# Patient Record
Sex: Female | Born: 1980
Health system: Southern US, Community
[De-identification: ages and names within clinical notes are randomized; demographics above are authoritative.]

## PROBLEM LIST (undated history)

## (undated) DIAGNOSIS — D649 Anemia, unspecified: Secondary | ICD-10-CM

## (undated) DIAGNOSIS — T8859XA Other complications of anesthesia, initial encounter: Secondary | ICD-10-CM

## (undated) HISTORY — PX: NO PAST SURGERIES: SHX2092

---

## 2019-03-09 DIAGNOSIS — Z118 Encounter for screening for other infectious and parasitic diseases: Secondary | ICD-10-CM | POA: Diagnosis not present

## 2019-03-09 DIAGNOSIS — Z01419 Encounter for gynecological examination (general) (routine) without abnormal findings: Secondary | ICD-10-CM | POA: Diagnosis not present

## 2019-03-09 DIAGNOSIS — N898 Other specified noninflammatory disorders of vagina: Secondary | ICD-10-CM | POA: Diagnosis not present

## 2019-03-09 DIAGNOSIS — Z6823 Body mass index (BMI) 23.0-23.9, adult: Secondary | ICD-10-CM | POA: Diagnosis not present

## 2019-03-09 DIAGNOSIS — Z124 Encounter for screening for malignant neoplasm of cervix: Secondary | ICD-10-CM | POA: Diagnosis not present

## 2019-03-09 DIAGNOSIS — N979 Female infertility, unspecified: Secondary | ICD-10-CM | POA: Diagnosis not present

## 2019-03-09 DIAGNOSIS — Z1151 Encounter for screening for human papillomavirus (HPV): Secondary | ICD-10-CM | POA: Diagnosis not present

## 2019-03-13 DIAGNOSIS — Z13220 Encounter for screening for lipoid disorders: Secondary | ICD-10-CM | POA: Diagnosis not present

## 2019-03-13 DIAGNOSIS — Z130 Encounter for screening for diseases of the blood and blood-forming organs and certain disorders involving the immune mechanism: Secondary | ICD-10-CM | POA: Diagnosis not present

## 2019-03-13 DIAGNOSIS — Z0000 Encounter for general adult medical examination without abnormal findings: Secondary | ICD-10-CM | POA: Diagnosis not present

## 2019-03-13 DIAGNOSIS — Z131 Encounter for screening for diabetes mellitus: Secondary | ICD-10-CM | POA: Diagnosis not present

## 2019-03-13 DIAGNOSIS — Z1329 Encounter for screening for other suspected endocrine disorder: Secondary | ICD-10-CM | POA: Diagnosis not present

## 2019-03-13 DIAGNOSIS — Z3169 Encounter for other general counseling and advice on procreation: Secondary | ICD-10-CM | POA: Diagnosis not present

## 2019-04-02 DIAGNOSIS — N970 Female infertility associated with anovulation: Secondary | ICD-10-CM | POA: Diagnosis not present

## 2019-04-06 DIAGNOSIS — Z3169 Encounter for other general counseling and advice on procreation: Secondary | ICD-10-CM | POA: Diagnosis not present

## 2019-04-20 DIAGNOSIS — N979 Female infertility, unspecified: Secondary | ICD-10-CM | POA: Diagnosis not present

## 2019-04-20 DIAGNOSIS — Z3689 Encounter for other specified antenatal screening: Secondary | ICD-10-CM | POA: Diagnosis not present

## 2019-04-20 DIAGNOSIS — Z3201 Encounter for pregnancy test, result positive: Secondary | ICD-10-CM | POA: Diagnosis not present

## 2019-04-20 LAB — OB RESULTS CONSOLE ABO/RH: RH Type: POSITIVE

## 2019-04-20 LAB — OB RESULTS CONSOLE ANTIBODY SCREEN: Antibody Screen: NEGATIVE

## 2019-05-15 DIAGNOSIS — Z3201 Encounter for pregnancy test, result positive: Secondary | ICD-10-CM | POA: Diagnosis not present

## 2019-05-30 DIAGNOSIS — Z118 Encounter for screening for other infectious and parasitic diseases: Secondary | ICD-10-CM | POA: Diagnosis not present

## 2019-05-30 DIAGNOSIS — O09521 Supervision of elderly multigravida, first trimester: Secondary | ICD-10-CM | POA: Diagnosis not present

## 2019-05-30 DIAGNOSIS — Z3A10 10 weeks gestation of pregnancy: Secondary | ICD-10-CM | POA: Diagnosis not present

## 2019-05-30 DIAGNOSIS — Z3689 Encounter for other specified antenatal screening: Secondary | ICD-10-CM | POA: Diagnosis not present

## 2019-05-30 LAB — OB RESULTS CONSOLE HEPATITIS B SURFACE ANTIGEN: Hepatitis B Surface Ag: NEGATIVE

## 2019-05-30 LAB — OB RESULTS CONSOLE RUBELLA ANTIBODY, IGM: Rubella: IMMUNE

## 2019-05-30 LAB — OB RESULTS CONSOLE HIV ANTIBODY (ROUTINE TESTING): HIV: NONREACTIVE

## 2019-05-30 LAB — OB RESULTS CONSOLE RPR: RPR: NONREACTIVE

## 2019-06-04 ENCOUNTER — Inpatient Hospital Stay (HOSPITAL_COMMUNITY): Admission: AD | Admit: 2019-06-04 | Payer: Self-pay | Source: Home / Self Care | Admitting: Obstetrics

## 2019-06-19 DIAGNOSIS — Z3A13 13 weeks gestation of pregnancy: Secondary | ICD-10-CM | POA: Diagnosis not present

## 2019-06-19 DIAGNOSIS — O09521 Supervision of elderly multigravida, first trimester: Secondary | ICD-10-CM | POA: Diagnosis not present

## 2019-06-19 DIAGNOSIS — Z3682 Encounter for antenatal screening for nuchal translucency: Secondary | ICD-10-CM | POA: Diagnosis not present

## 2019-06-28 DIAGNOSIS — Z111 Encounter for screening for respiratory tuberculosis: Secondary | ICD-10-CM | POA: Diagnosis not present

## 2019-06-30 DIAGNOSIS — Z111 Encounter for screening for respiratory tuberculosis: Secondary | ICD-10-CM | POA: Diagnosis not present

## 2019-07-06 DIAGNOSIS — Z111 Encounter for screening for respiratory tuberculosis: Secondary | ICD-10-CM | POA: Diagnosis not present

## 2019-07-12 DIAGNOSIS — O09522 Supervision of elderly multigravida, second trimester: Secondary | ICD-10-CM | POA: Diagnosis not present

## 2019-07-12 DIAGNOSIS — Z361 Encounter for antenatal screening for raised alphafetoprotein level: Secondary | ICD-10-CM | POA: Diagnosis not present

## 2019-07-12 DIAGNOSIS — Z3A16 16 weeks gestation of pregnancy: Secondary | ICD-10-CM | POA: Diagnosis not present

## 2019-08-02 DIAGNOSIS — O09522 Supervision of elderly multigravida, second trimester: Secondary | ICD-10-CM | POA: Diagnosis not present

## 2019-08-02 DIAGNOSIS — Z3A19 19 weeks gestation of pregnancy: Secondary | ICD-10-CM | POA: Diagnosis not present

## 2019-08-14 ENCOUNTER — Ambulatory Visit
Admission: RE | Admit: 2019-08-14 | Discharge: 2019-08-14 | Disposition: A | Discharge status: Home / Self Care | Payer: BC Managed Care – PPO | Source: Ambulatory Visit | Attending: Internal Medicine | Admitting: Internal Medicine

## 2019-08-14 ENCOUNTER — Other Ambulatory Visit: Payer: Self-pay

## 2019-08-14 ENCOUNTER — Ambulatory Visit: Payer: BC Managed Care – PPO | Admitting: Internal Medicine

## 2019-08-14 ENCOUNTER — Encounter: Payer: Self-pay | Admitting: Internal Medicine

## 2019-08-14 DIAGNOSIS — J984 Other disorders of lung: Secondary | ICD-10-CM | POA: Diagnosis not present

## 2019-08-14 DIAGNOSIS — R7612 Nonspecific reaction to cell mediated immunity measurement of gamma interferon antigen response without active tuberculosis: Secondary | ICD-10-CM

## 2019-08-14 DIAGNOSIS — J90 Pleural effusion, not elsewhere classified: Secondary | ICD-10-CM | POA: Diagnosis not present

## 2019-08-14 NOTE — Progress Notes (Signed)
°    [facility]      Reason for Consult: probable latent Tb    Referring Physician: Dr. Juliene Pina    Patient ID: Virginia Strickland, female    DOB: 1981/02/18, 39 y.o.   MRN: [id number]  HPI:   She is here for evaluation of a positive Quantiferon Gold test.   She is currently pregnant with a due date in October and was tested as part of routine testing by her OB/GYN.  She is HIV negative, hepatitis B negative.  She is having no fever, no sob, no DOE, no cough or sputum production.  No night sweats.  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  She has obtained CMA status here.  She has not had any exposures recently that she is aware of. She did have the BCG vaccine.  She refused an interpreter, interview done in [place].   PMH: pregnant  Prior to Admission medications   Medication Sig Start Date End Date Taking? Authorizing Provider  Prenatal Vit-Fe Fumarate-FA (PRENATAL MULTIVITAMIN) TABS tablet Take 1 tablet by mouth daily at 12 noon.   Yes [provider]    No Known Allergies  Social History   Tobacco Use   Smoking status: Not on file  Substance Use Topics   Alcohol use: Not on file   Drug use: Not on file   [facility]: no known family members with Tb  Review of Systems  Constitutional: negative for fevers and chills Respiratory: negative for cough, sputum, hemoptysis, pleurisy/chest pain, pneumonia or dyspnea on exertion Gastrointestinal: negative for nausea and diarrhea Integument/breast: negative for rash All other systems reviewed and are negative    Constitutional: in no apparent distress There were no vitals filed for this visit. EYES: anicteric Cardiovascular: Cor RRR Respiratory: clear GI: gravid Musculoskeletal: no edema Skin: negatives: no rash Neuro: non-focal  Labs: No results found for: WBC, HGB, HCT, MCV, PLT No results found for: CREATININE, BUN, NA, K, CL, CO2 No results found for: ALT, AST,  GGT, ALKPHOS, BILITOT, INR   Assessment: Latent Tb.  I discussed the risks of latent Tb and conversion to active Tb and options for treatment.  She has no symptoms and verified with a CXR that her lungs are clear.  At this time, she does not need treatment and will offer her treatment for latent Tb after delivery of her baby.    Plan: 1) CXR - no acute disease 2) rtc 3-4 months after delivery for consideration of latent Tb treatment.

## 2019-08-15 ENCOUNTER — Encounter: Payer: Self-pay | Admitting: Internal Medicine

## 2019-08-15 ENCOUNTER — Telehealth: Payer: Self-pay

## 2019-08-15 NOTE — Telephone Encounter (Signed)
Using pacific interpreters relayed chest xray results to patient; had to leave VM. Patient instructed to call with any questions or concerns.  Kasiya Burck Loyola Mast, RN

## 2019-08-15 NOTE — Telephone Encounter (Signed)
-----   Message from Gardiner Barefoot, MD sent at 08/15/2019 10:29 AM EDT ----- Can you let her know her CXR is just fine; has latent Tb.  Will treat her sometime in the spring after she delivers later this year.  thanks

## 2019-08-23 NOTE — Telephone Encounter (Signed)
Patient returned call and LPN was able to relay results and assist patient in signing up for MyChart.  Virginia Strickland

## 2019-08-31 DIAGNOSIS — Z3A23 23 weeks gestation of pregnancy: Secondary | ICD-10-CM | POA: Diagnosis not present

## 2019-08-31 DIAGNOSIS — O09522 Supervision of elderly multigravida, second trimester: Secondary | ICD-10-CM | POA: Diagnosis not present

## 2019-09-21 DIAGNOSIS — Z3A26 26 weeks gestation of pregnancy: Secondary | ICD-10-CM | POA: Diagnosis not present

## 2019-09-21 DIAGNOSIS — O09522 Supervision of elderly multigravida, second trimester: Secondary | ICD-10-CM | POA: Diagnosis not present

## 2019-10-04 DIAGNOSIS — Z23 Encounter for immunization: Secondary | ICD-10-CM | POA: Diagnosis not present

## 2019-10-04 DIAGNOSIS — O09523 Supervision of elderly multigravida, third trimester: Secondary | ICD-10-CM | POA: Diagnosis not present

## 2019-10-04 DIAGNOSIS — Z3A28 28 weeks gestation of pregnancy: Secondary | ICD-10-CM | POA: Diagnosis not present

## 2019-10-04 DIAGNOSIS — Z3689 Encounter for other specified antenatal screening: Secondary | ICD-10-CM | POA: Diagnosis not present

## 2019-10-18 DIAGNOSIS — O09523 Supervision of elderly multigravida, third trimester: Secondary | ICD-10-CM | POA: Diagnosis not present

## 2019-10-18 DIAGNOSIS — Z3A30 30 weeks gestation of pregnancy: Secondary | ICD-10-CM | POA: Diagnosis not present

## 2019-11-01 DIAGNOSIS — O09523 Supervision of elderly multigravida, third trimester: Secondary | ICD-10-CM | POA: Diagnosis not present

## 2019-11-01 DIAGNOSIS — O3663X0 Maternal care for excessive fetal growth, third trimester, not applicable or unspecified: Secondary | ICD-10-CM | POA: Diagnosis not present

## 2019-11-01 DIAGNOSIS — Z3A32 32 weeks gestation of pregnancy: Secondary | ICD-10-CM | POA: Diagnosis not present

## 2019-11-15 DIAGNOSIS — O09523 Supervision of elderly multigravida, third trimester: Secondary | ICD-10-CM | POA: Diagnosis not present

## 2019-11-15 DIAGNOSIS — O351XX0 Maternal care for (suspected) chromosomal abnormality in fetus, not applicable or unspecified: Secondary | ICD-10-CM | POA: Diagnosis not present

## 2019-11-15 DIAGNOSIS — Z3A34 34 weeks gestation of pregnancy: Secondary | ICD-10-CM | POA: Diagnosis not present

## 2019-11-29 ENCOUNTER — Other Ambulatory Visit: Payer: Self-pay

## 2019-11-29 ENCOUNTER — Inpatient Hospital Stay: Payer: BC Managed Care – PPO

## 2019-11-29 ENCOUNTER — Encounter (HOSPITAL_COMMUNITY): Payer: Self-pay | Admitting: Obstetrics & Gynecology

## 2019-11-29 ENCOUNTER — Inpatient Hospital Stay (HOSPITAL_BASED_OUTPATIENT_CLINIC_OR_DEPARTMENT_OTHER): Payer: BC Managed Care – PPO

## 2019-11-29 ENCOUNTER — Inpatient Hospital Stay (HOSPITAL_COMMUNITY)
Admission: AD | Admit: 2019-11-29 | Discharge: 2019-11-29 | Disposition: A | Discharge status: Home / Self Care | Payer: BC Managed Care – PPO | Attending: Obstetrics & Gynecology | Admitting: Obstetrics & Gynecology

## 2019-11-29 DIAGNOSIS — Z3685 Encounter for antenatal screening for Streptococcus B: Secondary | ICD-10-CM | POA: Diagnosis not present

## 2019-11-29 DIAGNOSIS — Z3A36 36 weeks gestation of pregnancy: Secondary | ICD-10-CM

## 2019-11-29 DIAGNOSIS — O289 Unspecified abnormal findings on antenatal screening of mother: Secondary | ICD-10-CM

## 2019-11-29 DIAGNOSIS — O368130 Decreased fetal movements, third trimester, not applicable or unspecified: Secondary | ICD-10-CM | POA: Diagnosis not present

## 2019-11-29 DIAGNOSIS — O368390 Maternal care for abnormalities of the fetal heart rate or rhythm, unspecified trimester, not applicable or unspecified: Secondary | ICD-10-CM

## 2019-11-29 DIAGNOSIS — O09523 Supervision of elderly multigravida, third trimester: Secondary | ICD-10-CM | POA: Insufficient documentation

## 2019-11-29 DIAGNOSIS — O403XX0 Polyhydramnios, third trimester, not applicable or unspecified: Secondary | ICD-10-CM | POA: Diagnosis not present

## 2019-11-29 DIAGNOSIS — Z79899 Other long term (current) drug therapy: Secondary | ICD-10-CM | POA: Diagnosis not present

## 2019-11-29 DIAGNOSIS — O321XX0 Maternal care for breech presentation, not applicable or unspecified: Secondary | ICD-10-CM | POA: Diagnosis not present

## 2019-11-29 HISTORY — DX: Anemia, unspecified: D64.9

## 2019-11-29 LAB — OB RESULTS CONSOLE GBS: GBS: NEGATIVE

## 2019-11-29 NOTE — MAU Provider Note (Signed)
History     CSN: [id number]  Arrival date and time: 11/29/19 1218   First Provider Initiated Contact with Patient 11/29/19 1300      Chief Complaint  Patient presents with   Decreased Fetal Movement   failed NST   Ms. Virginia Strickland is a 39 y.o. year old [id number] female at 6068w4d weeks gestation who was [facility] for prolonged monitoring. She failed an NST in the office today. She denies VB, LOF or pain/contractions. Her pregnancy is complicated by AMA, LT renal pyelectasis and breech presentation (as of 11/15/19). Dr. Juliene PinaMody is her primary provider.    OB History    Gravida  2   Para      Term      Preterm      AB  1   Living  0     SAB  1   TAB      Ectopic      Multiple      Live Births  0           Past Medical History:  Diagnosis Date   Anemia     Past Surgical History:  Procedure Laterality Date   NO PAST SURGERIES      Family History  Problem Relation Age of Onset   Hypertension Mother    Asthma Mother    Hypertension Father     Social History   Tobacco Use   Smoking status: Never Smoker   Smokeless tobacco: Never Used  Vaping Use   Vaping Use: Never used  Substance Use Topics   Alcohol use: Never   Drug use: Never    Allergies: No Known Allergies  Medications Prior to Admission  Medication Sig Dispense Refill Last Dose   Prenatal Vit-Fe Fumarate-FA (PRENATAL MULTIVITAMIN) TABS tablet Take 1 tablet by mouth daily at 12 noon.   11/28/2019 at Unknown time    Review of Systems  Constitutional: Negative.   HENT: Negative.   Eyes: Negative.   Respiratory: Negative.   Cardiovascular: Negative.   Gastrointestinal: Negative.   Endocrine: Negative.   Genitourinary: Negative.   Musculoskeletal: Negative.   Skin: Negative.   Allergic/Immunologic: Negative.   Neurological: Negative.   Hematological: Negative.   Psychiatric/Behavioral: Negative.    Physical Exam   Blood pressure  119/78, pulse 85, temperature 97.7 F (36.5 C), temperature source Oral, resp. rate 16, weight 78.8 kg, SpO2 100 %.  Physical Exam Vitals and nursing note reviewed.  Constitutional:      Appearance: Normal appearance.  HENT:     Head: Normocephalic and atraumatic.  Cardiovascular:     Rate and Rhythm: Normal rate.  Pulmonary:     Effort: Pulmonary effort is normal.  Genitourinary:    Comments: deferred Skin:    General: Skin is warm and dry.  Neurological:     Mental Status: She is alert and oriented to person, place, and time.  Psychiatric:        Mood and Affect: Mood normal.        Behavior: Behavior normal.        Thought Content: Thought content normal.        Judgment: Judgment normal.    REACTIVE NST - FHR: 130 bpm / moderate variability / accels present / decels absent / TOCO: regular every 10 mins -- patient not aware of contractions  MAU Course  Procedures  MDM CEFM BPP U/S US MFM FETAL BPP WO NON STRESS  Result Date:  11/29/2019 ----------------------------------------------------------------------  OBSTETRICS REPORT                       (Signed Final 11/29/2019 02:23 pm) ---------------------------------------------------------------------- Patient Info  ID #:       [id number]                          D.O.B.:  1980/12/13 (39 yrs)  Name:       Virginia Strickland                  Visit Date: 11/29/2019 01:59 pm              Strickland ---------------------------------------------------------------------- Performed By  Attending:        Lin Landsman      Ref. Address:     [address]                    MD                                                             [address]                                                             [place], [place]                                                             [address]  Performed By:     Reinaldo Raddle            [facility]  Referred By:       Raelyn Mora CNM ---------------------------------------------------------------------- Orders  #  Description                           Code        Ordered By  1  [place] MFM FETAL BPP WO NON               54008.67    Zarin Hagmann     STRESS ----------------------------------------------------------------------  #  Order #                     Accession #                Episode #  1  [id number]  [id number]                 [id number] ---------------------------------------------------------------------- Indications  [redacted] weeks gestation of pregnancy                Z3A.36  Non-reactive NST                               O28.9 ---------------------------------------------------------------------- Fetal Evaluation  Num Of Fetuses:         1  Fetal Heart Rate(bpm):  158  Cardiac Activity:       Observed  Presentation:           Breech  Placenta:               Anterior  P. Cord Insertion:      Visualized, central  Amniotic Fluid  AFI FV:      Subjectively upper-normal  AFI Sum(cm)     %Tile       Largest Pocket(cm)  27.63           97          8.41  RUQ(cm)       RLQ(cm)       LUQ(cm)        LLQ(cm)  5.91          8.41          8.33           4.98 ---------------------------------------------------------------------- Biophysical Evaluation  Amniotic F.V:   Increased                  F. Tone:        Observed  F. Movement:    Observed                   Score:          8/8  F. Breathing:   Observed ---------------------------------------------------------------------- Biometry  LV:        4.9  mm ---------------------------------------------------------------------- OB History  Gravidity:    2  Living:       0 ---------------------------------------------------------------------- Gestational Age  Clinical EDD:  36w 4d                                        EDD:   12/23/19  Best:          36w 4d     Det. By:  Clinical EDD             EDD:   12/23/19  ---------------------------------------------------------------------- Anatomy  Ventricles:            Appears normal         Stomach:                Appears normal, left                                                                        sided  Diaphragm:             Appears normal         Bladder:  Appears normal ---------------------------------------------------------------------- Cervix Uterus Adnexa  Cervix  Not visualized (advanced GA >24wks)  Uterus  No abnormality visualized.  Right Ovary  Not visualized.  Left Ovary  Not visualized.  Cul De Sac  No free fluid seen.  Adnexa  No adnexal mass visualized. ---------------------------------------------------------------------- Impression  Limited exam to assess non-reactive NST.  Biophysical profile 8/8 with good fetal movement and  amniotic fluid volume  Consider daily maternal kick counts.  Polyhydramnios is noted today. ---------------------------------------------------------------------- Recommendations  Follow up growth in 4 weeks  Consider weekly testing. ----------------------------------------------------------------------               Lin Landsman, MD Electronically Signed Final Report   11/29/2019 02:23 pm ----------------------------------------------------------------------   Assessment and Plan  Decreased fetal movements in third trimester, single or unspecified fetus - Dr. Juliene Pina on unit at 1420 and assumes care of patient. Patient instructions and discharge by Dr. Juliene Pina.    Raelyn Mora, MSN, CNM 11/29/2019, 1:07 PM

## 2019-11-29 NOTE — MAU Note (Addendum)
Office had called in. Pt had failed NST, ? Issues on [place].  For prolonged monitoring and BPP.  Pt denies any bleeding, leaking or pain.

## 2019-11-29 NOTE — Progress Notes (Addendum)
MD note Pt seen in office for NST, AFI and position check since AMA at age 39. NST overall mod variability and no decels, but accels only 10/x10  Sono - AFI up to 24.5 cm from 17 cm 2 wks back, Breech, possible skin edema on head and no active fetal movements or breathing noted while scanning.   Pt is NPO since 9 am, advised to stay NPO and sent to MAU for longer monitoring and BPP and reassess after that, will consult MFM  V.Joann Jorge MD  Spoke with Dr Grace Bushy, BPP 8/8, polyhydramnios noted, no skin edema on head.  MAU NST reactive  Mother feels FMs since leaving my office.  Reviewed strict FAC three times daily Labor s/s. Breech-- recc C/s at 39 wks.  Office visit 2x/wk, NST on 10/4, DrMody's office will call her   --V.ModyMD

## 2019-11-29 NOTE — Discharge Instructions (Signed)
Third Trimester of Pregnancy  The third trimester is from week 28 through week 40 (months 7 through 9). This trimester is when your unborn baby (fetus) is growing very fast. At the end of the ninth month, the unborn baby is about 20 inches in length. It weighs about 6-10 pounds. Follow these instructions at home: Medicines  Take over-the-counter and prescription medicines only as told by your doctor. Some medicines are safe and some medicines are not safe during pregnancy.  Take a prenatal vitamin that contains at least 600 micrograms (mcg) of folic acid.  If you have trouble pooping (constipation), take medicine that will make your stool soft (stool softener) if your doctor approves. Eating and drinking   Eat regular, healthy meals.  Avoid raw meat and uncooked cheese.  If you get low calcium from the food you eat, talk to your doctor about taking a daily calcium supplement.  Eat four or five small meals rather than three large meals a day.  Avoid foods that are high in fat and sugars, such as fried and sweet foods.  To prevent constipation: ? Eat foods that are high in fiber, like fresh fruits and vegetables, whole grains, and beans. ? Drink enough fluids to keep your pee (urine) clear or pale yellow. Activity  Exercise only as told by your doctor. Stop exercising if you start to have cramps.  Avoid heavy lifting, wear low heels, and sit up straight.  Do not exercise if it is too hot, too humid, or if you are in a place of great height (high altitude).  You may continue to have sex unless your doctor tells you not to. Relieving pain and discomfort  Wear a good support bra if your breasts are tender.  Take frequent breaks and rest with your legs raised if you have leg cramps or low back pain.  Take warm water baths (sitz baths) to soothe pain or discomfort caused by hemorrhoids. Use hemorrhoid cream if your doctor approves.  If you develop puffy, bulging veins (varicose  veins) in your legs: ? Wear support hose or compression stockings as told by your doctor. ? Raise (elevate) your feet for 15 minutes, 3-4 times a day. ? Limit salt in your food. Safety  Wear your seat belt when driving.  Make a list of emergency phone numbers, including numbers for family, friends, the hospital, and police and fire departments. Preparing for your baby's arrival To prepare for the arrival of your baby:  Take prenatal classes.  Practice driving to the hospital.  Visit the hospital and tour the maternity area.  Talk to your work about taking leave once the baby comes.  Pack your hospital bag.  Prepare the baby's room.  Go to your doctor visits.  Buy a rear-facing car seat. Learn how to install it in your car. General instructions  Do not use hot tubs, steam rooms, or saunas.  Do not use any products that contain nicotine or tobacco, such as cigarettes and e-cigarettes. If you need help quitting, ask your doctor.  Do not drink alcohol.  Do not douche or use tampons or scented sanitary pads.  Do not cross your legs for long periods of time.  Do not travel for long distances unless you must. Only do so if your doctor says it is okay.  Visit your dentist if you have not gone during your pregnancy. Use a soft toothbrush to brush your teeth. Be gentle when you floss.  Avoid cat litter boxes and soil   used by cats. These carry germs that can cause birth defects in the baby and can cause a loss of your baby (miscarriage) or stillbirth.  Keep all your prenatal visits as told by your doctor. This is important. Contact a doctor if:  You are not sure if you are in labor or if your water has broken.  You are dizzy.  You have mild cramps or pressure in your lower belly.  You have a nagging pain in your belly area.  You continue to feel sick to your stomach, you throw up, or you have watery poop.  You have bad smelling fluid coming from your vagina.  You have  pain when you pee. Get help right away if:  You have a fever.  You are leaking fluid from your vagina.  You are spotting or bleeding from your vagina.  You have severe belly cramps or pain.  You lose or gain weight quickly.  You have trouble catching your breath and have chest pain.  You notice sudden or extreme puffiness (swelling) of your face, hands, ankles, feet, or legs.  You have not felt the baby move in over an hour.  You have severe headaches that do not go away with medicine.  You have trouble seeing.  You are leaking, or you are having a gush of fluid, from your vagina before you are 37 weeks.  You have regular belly spasms (contractions) before you are 37 weeks. Summary  The third trimester is from week 28 through week 40 (months 7 through 9). This time is when your unborn baby is growing very fast.  Follow your doctor's advice about medicine, food, and activity.  Get ready for the arrival of your baby by taking prenatal classes, getting all the baby items ready, preparing the baby's room, and visiting your doctor to be checked.  Get help right away if you are bleeding from your vagina, or you have chest pain and trouble catching your breath, or if you have not felt your baby move in over an hour. This information is not intended to replace advice given to you by your health care provider. Make sure you discuss any questions you have with your health care provider. Document Revised: 06/08/2018 Document Reviewed: 03/23/2016 Elsevier Patient Education  2020 Elsevier Inc.   Fetal Movement Counts Patient Name: ________________________________________________ Patient Due Date: ____________________ What is a fetal movement count?  A fetal movement count is the number of times that you feel your baby move during a certain amount of time. This may also be called a fetal kick count. A fetal movement count is recommended for every pregnant woman. You may be asked to  start counting fetal movements as early as week 28 of your pregnancy. Pay attention to when your baby is most active. You may notice your baby's sleep and wake cycles. You may also notice things that make your baby move more. You should do a fetal movement count:  When your baby is normally most active.  At the same time each day. A good time to count movements is while you are resting, after having something to eat and drink. How do I count fetal movements? 1. Find a quiet, comfortable area. Sit, or lie down on your side. 2. Write down the date, the start time and stop time, and the number of movements that you felt between those two times. Take this information with you to your health care visits. 3. Write down your start time when you feel   the first movement. 4. Count kicks, flutters, swishes, rolls, and jabs. You should feel at least 10 movements. 5. You may stop counting after you have felt 10 movements, or if you have been counting for 2 hours. Write down the stop time. 6. If you do not feel 10 movements in 2 hours, contact your health care provider for further instructions. Your health care provider may want to do additional tests to assess your baby's well-being. Contact a health care provider if:  You feel fewer than 10 movements in 2 hours.  Your baby is not moving like he or she usually does. Date: ____________ Start time: ____________ Stop time: ____________ Movements: ____________ Date: ____________ Start time: ____________ Stop time: ____________ Movements: ____________ Date: ____________ Start time: ____________ Stop time: ____________ Movements: ____________ Date: ____________ Start time: ____________ Stop time: ____________ Movements: ____________ Date: ____________ Start time: ____________ Stop time: ____________ Movements: ____________ Date: ____________ Start time: ____________ Stop time: ____________ Movements: ____________ Date: ____________ Start time: ____________  Stop time: ____________ Movements: ____________ Date: ____________ Start time: ____________ Stop time: ____________ Movements: ____________ Date: ____________ Start time: ____________ Stop time: ____________ Movements: ____________ This information is not intended to replace advice given to you by your health care provider. Make sure you discuss any questions you have with your health care provider. Document Revised: 10/05/2018 Document Reviewed: 10/05/2018 Elsevier Patient Education  2020 Elsevier Inc.  

## 2019-12-03 DIAGNOSIS — O09523 Supervision of elderly multigravida, third trimester: Secondary | ICD-10-CM | POA: Diagnosis not present

## 2019-12-03 DIAGNOSIS — O403XX0 Polyhydramnios, third trimester, not applicable or unspecified: Secondary | ICD-10-CM | POA: Diagnosis not present

## 2019-12-03 DIAGNOSIS — Z3A37 37 weeks gestation of pregnancy: Secondary | ICD-10-CM | POA: Diagnosis not present

## 2019-12-04 ENCOUNTER — Other Ambulatory Visit: Payer: Self-pay | Admitting: Obstetrics and Gynecology

## 2019-12-04 ENCOUNTER — Telehealth (HOSPITAL_COMMUNITY): Payer: Self-pay | Admitting: *Deleted

## 2019-12-04 NOTE — Telephone Encounter (Signed)
Preadmission screen  

## 2019-12-04 NOTE — Patient Instructions (Signed)
Namiko Pritts Oumarou  12/04/2019   Your procedure is scheduled on:  12/17/2019  Arrive at 1230 at Entrance C on [address] at [facility]  and [employer]. You are invited to use the FREE valet parking or use the Visitor's parking deck.  Pick up the phone at the desk and dial [phone].  Call this number if you have problems the morning of surgery: [phone]  Remember:   Do not eat food:(After Midnight) Desps de medianoche.  Do not drink clear liquids: (After Midnight) Desps de medianoche.  Take these medicines the morning of surgery with A SIP OF WATER:  none   Do not wear jewelry, make-up or nail polish.  Do not wear lotions, powders, or perfumes. Do not wear deodorant.  Do not shave 48 hours prior to surgery.  Do not bring valuables to the hospital.  [facility] is not   responsible for any belongings or valuables brought to the hospital.  Contacts, dentures or bridgework may not be worn into surgery.  Leave suitcase in the car. After surgery it may be brought to your room.  For patients admitted to the hospital, checkout time is 11:00 AM the day of              discharge.      Please read over the following fact sheets that you were given:     Preparing for Surgery

## 2019-12-05 ENCOUNTER — Telehealth (HOSPITAL_COMMUNITY): Payer: Self-pay | Admitting: *Deleted

## 2019-12-05 NOTE — Pre-Procedure Instructions (Signed)
Interpreter number [phone]

## 2019-12-05 NOTE — Telephone Encounter (Signed)
Preadmission screen  

## 2019-12-07 ENCOUNTER — Encounter (HOSPITAL_COMMUNITY): Payer: Self-pay

## 2019-12-07 DIAGNOSIS — O09523 Supervision of elderly multigravida, third trimester: Secondary | ICD-10-CM | POA: Diagnosis not present

## 2019-12-07 DIAGNOSIS — Z3A37 37 weeks gestation of pregnancy: Secondary | ICD-10-CM | POA: Diagnosis not present

## 2019-12-07 DIAGNOSIS — O321XX0 Maternal care for breech presentation, not applicable or unspecified: Secondary | ICD-10-CM | POA: Diagnosis not present

## 2019-12-07 DIAGNOSIS — O403XX0 Polyhydramnios, third trimester, not applicable or unspecified: Secondary | ICD-10-CM | POA: Diagnosis not present

## 2019-12-07 NOTE — Pre-Procedure Instructions (Signed)
Interpreter number [phone]

## 2019-12-13 DIAGNOSIS — O403XX0 Polyhydramnios, third trimester, not applicable or unspecified: Secondary | ICD-10-CM | POA: Diagnosis not present

## 2019-12-13 DIAGNOSIS — Z3A38 38 weeks gestation of pregnancy: Secondary | ICD-10-CM | POA: Diagnosis not present

## 2019-12-13 DIAGNOSIS — O09523 Supervision of elderly multigravida, third trimester: Secondary | ICD-10-CM | POA: Diagnosis not present

## 2019-12-14 ENCOUNTER — Other Ambulatory Visit (HOSPITAL_COMMUNITY): Payer: BC Managed Care – PPO

## 2019-12-14 ENCOUNTER — Other Ambulatory Visit (HOSPITAL_COMMUNITY)
Admission: RE | Admit: 2019-12-14 | Discharge: 2019-12-14 | Disposition: A | Discharge status: Home / Self Care | Payer: BC Managed Care – PPO | Source: Ambulatory Visit | Attending: Obstetrics & Gynecology | Admitting: Obstetrics & Gynecology

## 2019-12-17 ENCOUNTER — Encounter (HOSPITAL_COMMUNITY): Admission: RE | Payer: Self-pay | Source: Home / Self Care

## 2019-12-17 ENCOUNTER — Inpatient Hospital Stay (HOSPITAL_COMMUNITY)
Admission: RE | Admit: 2019-12-17 | Payer: BC Managed Care – PPO | Source: Home / Self Care | Admitting: Obstetrics and Gynecology

## 2019-12-17 SURGERY — Surgical Case
Anesthesia: Spinal

## 2019-12-19 ENCOUNTER — Other Ambulatory Visit: Payer: Self-pay | Admitting: Obstetrics & Gynecology

## 2019-12-19 DIAGNOSIS — O09523 Supervision of elderly multigravida, third trimester: Secondary | ICD-10-CM | POA: Diagnosis not present

## 2019-12-19 DIAGNOSIS — O320XX0 Maternal care for unstable lie, not applicable or unspecified: Secondary | ICD-10-CM | POA: Diagnosis not present

## 2019-12-19 DIAGNOSIS — Z3A39 39 weeks gestation of pregnancy: Secondary | ICD-10-CM | POA: Diagnosis not present

## 2019-12-19 DIAGNOSIS — O403XX0 Polyhydramnios, third trimester, not applicable or unspecified: Secondary | ICD-10-CM | POA: Diagnosis not present

## 2019-12-19 NOTE — H&P (Signed)
Virginia Strickland is a 39 y.o. female G24P0. At 39.3 wks EDC 10/24 by 1st trim sono. presenting for induction for AMA at 7yrs, polyhydramnios and unstable lie.  Anovulatory infertility, this is Femara conception, prior SAB in 2019 w Clomid.  -AMA at 35yrs, Nl NIPS and 1st trim NT.  -Nl serial growth sono from 28 wks, EFW today 7'13" at 52% and AFI 28 cm.   -Polyhydramnios from 36 wks  -At 36 wks- reported somewhat decr FMS and office NST was NR NST, fetal skin edema suspected doing BPP, so she was sent for prolonged monitoring to MAU, was reactive, BPP 8/8, AFI was 27 cm, Dr Grace Bushy reported no skin edema, so was reassuring. Since then [facility] NST has been reactive.  -Breech, spontaneous version to Vertex at 38 wks, so c/section was cancelled. .  - fetal pyelectasis noted Rt kid 6.4 mm and left kid 7 mm  OB History    Gravida  2   Para      Term      Preterm      AB  1   Living  0     SAB  1   TAB      Ectopic      Multiple      Live Births  0          Past Medical History:  Diagnosis Date  . Anemia    Past Surgical History:  Procedure Laterality Date  . NO PAST SURGERIES     Family History: family history includes Asthma in her mother; Hypertension in her father and mother. Social History:  reports that she has never smoked. She has never used smokeless tobacco. She reports that she does not drink alcohol and does not use drugs.     Maternal Diabetes: No Genetic Screening: Normal NIPS and nl NT in 1st trim Maternal Ultrasounds/Referrals: Fetal renal pyelectasis Fetal Ultrasounds or other Referrals:  None Maternal Substance Abuse:  No Significant Maternal Medications:  None Significant Maternal Lab Results:  Group B Strep negative Other Comments:  polyhydramnios  Review of Systems History   There were no vitals taken for this visit. Exam Physical Exam  Physical exam:  A&O x 3, no acute distress. Pleasant HEENT neg, no thyromegaly Lungs CTA  bilat CV RRR, S1S2 normal Abdo soft, non tender, non acute Extr no edema/ tenderness Pelvic Cx closed, Vx, floating, difficult to reach but sono today- cephalic presentation. Borderline pelvis vs pt guarding FHT  130s reactive NST in office  Toco none  Prenatal labs: ABO, Rh: O/Positive/-- (02/19 0000) Antibody: Negative (02/19 0000) Rubella: Immune (03/31 0000) RPR: Nonreactive (03/31 0000)  HBsAg: Negative (03/31 0000)  HIV: Non-reactive (03/31 0000)  GBS:   neg (11/29/19)   Assessment/Plan: 39 yo G2P0, 39.3 wks, IOL for AMA at 89yrs, polyhydramnios, unengaged Vx, unstable lie (was breech until 38 wk visit)  Cytotec 1-2 doses as needed, Foley bulb when possible, controlled AROM when dilated, watch descent, watch pelvis. Pt accepts C-section if needed.   Robley Fries 12/19/2019, 7:01 PM

## 2019-12-20 ENCOUNTER — Inpatient Hospital Stay (HOSPITAL_COMMUNITY)
Admission: AD | Admit: 2019-12-20 | Discharge: 2019-12-24 | DRG: 787 | Disposition: A | Discharge status: Home / Self Care | Payer: BC Managed Care – PPO | Attending: Obstetrics & Gynecology | Admitting: Obstetrics & Gynecology

## 2019-12-20 ENCOUNTER — Inpatient Hospital Stay (HOSPITAL_COMMUNITY): Payer: BC Managed Care – PPO

## 2019-12-20 ENCOUNTER — Inpatient Hospital Stay (HOSPITAL_COMMUNITY): Payer: BC Managed Care – PPO | Admitting: Anesthesiology

## 2019-12-20 ENCOUNTER — Encounter (HOSPITAL_COMMUNITY)
Admission: AD | Disposition: A | Discharge status: Home / Self Care | Payer: Self-pay | Source: Home / Self Care | Attending: Obstetrics & Gynecology

## 2019-12-20 ENCOUNTER — Other Ambulatory Visit: Payer: Self-pay

## 2019-12-20 ENCOUNTER — Encounter (HOSPITAL_COMMUNITY): Payer: Self-pay | Admitting: Obstetrics & Gynecology

## 2019-12-20 DIAGNOSIS — O320XX0 Maternal care for unstable lie, not applicable or unspecified: Secondary | ICD-10-CM | POA: Diagnosis not present

## 2019-12-20 DIAGNOSIS — D62 Acute posthemorrhagic anemia: Secondary | ICD-10-CM | POA: Diagnosis not present

## 2019-12-20 DIAGNOSIS — Z3A39 39 weeks gestation of pregnancy: Secondary | ICD-10-CM | POA: Diagnosis not present

## 2019-12-20 DIAGNOSIS — Z23 Encounter for immunization: Secondary | ICD-10-CM | POA: Diagnosis not present

## 2019-12-20 DIAGNOSIS — Z20822 Contact with and (suspected) exposure to covid-19: Secondary | ICD-10-CM | POA: Diagnosis present

## 2019-12-20 DIAGNOSIS — Z3490 Encounter for supervision of normal pregnancy, unspecified, unspecified trimester: Secondary | ICD-10-CM | POA: Diagnosis present

## 2019-12-20 DIAGNOSIS — O358XX0 Maternal care for other (suspected) fetal abnormality and damage, not applicable or unspecified: Secondary | ICD-10-CM | POA: Diagnosis not present

## 2019-12-20 DIAGNOSIS — O403XX0 Polyhydramnios, third trimester, not applicable or unspecified: Principal | ICD-10-CM | POA: Diagnosis present

## 2019-12-20 DIAGNOSIS — Z98891 History of uterine scar from previous surgery: Secondary | ICD-10-CM

## 2019-12-20 DIAGNOSIS — O99892 Other specified diseases and conditions complicating childbirth: Secondary | ICD-10-CM | POA: Diagnosis present

## 2019-12-20 DIAGNOSIS — O9081 Anemia of the puerperium: Secondary | ICD-10-CM | POA: Diagnosis not present

## 2019-12-20 DIAGNOSIS — Z3A00 Weeks of gestation of pregnancy not specified: Secondary | ICD-10-CM | POA: Diagnosis not present

## 2019-12-20 LAB — RESPIRATORY PANEL BY RT PCR (FLU A&B, COVID)
Influenza A by PCR: NEGATIVE
Influenza B by PCR: NEGATIVE
SARS Coronavirus 2 by RT PCR: NEGATIVE

## 2019-12-20 LAB — CBC
HCT: 40 % (ref 36.0–46.0)
Hemoglobin: 13.1 g/dL (ref 12.0–15.0)
MCH: 31.8 pg (ref 26.0–34.0)
MCHC: 32.8 g/dL (ref 30.0–36.0)
MCV: 97.1 fL (ref 80.0–100.0)
Platelets: 195 10*3/uL (ref 150–400)
RBC: 4.12 MIL/uL (ref 3.87–5.11)
RDW: 14.2 % (ref 11.5–15.5)
WBC: 5.7 10*3/uL (ref 4.0–10.5)
nRBC: 0 % (ref 0.0–0.2)

## 2019-12-20 LAB — RPR: RPR Ser Ql: NONREACTIVE

## 2019-12-20 LAB — TYPE AND SCREEN
ABO/RH(D): O POS
Antibody Screen: NEGATIVE

## 2019-12-20 SURGERY — Surgical Case
Anesthesia: Epidural | Wound class: Clean Contaminated

## 2019-12-20 MED ORDER — ACETAMINOPHEN 325 MG PO TABS: 650.0000 mg | ORAL_TABLET | ORAL | Status: DC | PRN

## 2019-12-20 MED ORDER — EPHEDRINE 5 MG/ML INJ: 10.0000 mg | INTRAVENOUS | Status: DC | PRN

## 2019-12-20 MED ORDER — CEFAZOLIN SODIUM-DEXTROSE 2-4 GM/100ML-% IV SOLN
INTRAVENOUS | Status: AC
  Filled 2019-12-20: qty 100

## 2019-12-20 MED ORDER — LACTATED RINGERS IV SOLN
500.0000 mL | INTRAVENOUS | Status: DC | PRN
  Administered 2019-12-20: 1000 mL via INTRAVENOUS

## 2019-12-20 MED ORDER — DEXAMETHASONE SODIUM PHOSPHATE 4 MG/ML IJ SOLN
INTRAMUSCULAR | Status: AC
  Filled 2019-12-20: qty 1

## 2019-12-20 MED ORDER — TERBUTALINE SULFATE 1 MG/ML IJ SOLN: 0.2500 mg | Freq: Once | INTRAMUSCULAR | Status: DC | PRN

## 2019-12-20 MED ORDER — FENTANYL CITRATE (PF) 100 MCG/2ML IJ SOLN
100.0000 ug | INTRAMUSCULAR | Status: DC | PRN
  Administered 2019-12-20: 100 ug via INTRAVENOUS

## 2019-12-20 MED ORDER — OXYTOCIN BOLUS FROM INFUSION: 333.0000 mL | Freq: Once | INTRAVENOUS | Status: DC

## 2019-12-20 MED ORDER — OXYTOCIN-SODIUM CHLORIDE 30-0.9 UT/500ML-% IV SOLN
INTRAVENOUS | Status: AC
  Filled 2019-12-20: qty 500

## 2019-12-20 MED ORDER — LACTATED RINGERS IV SOLN
500.0000 mL | Freq: Once | INTRAVENOUS | Status: AC
  Administered 2019-12-20: 500 mL via INTRAVENOUS

## 2019-12-20 MED ORDER — OXYTOCIN-SODIUM CHLORIDE 30-0.9 UT/500ML-% IV SOLN
2.5000 [IU]/h | INTRAVENOUS | Status: DC
  Filled 2019-12-20: qty 500

## 2019-12-20 MED ORDER — LACTATED RINGERS IV SOLN: INTRAVENOUS | Status: DC

## 2019-12-20 MED ORDER — SOD CITRATE-CITRIC ACID 500-334 MG/5ML PO SOLN
30.0000 mL | ORAL | Status: DC | PRN
  Administered 2019-12-21: 30 mL via ORAL
  Filled 2019-12-20: qty 15

## 2019-12-20 MED ORDER — ONDANSETRON HCL 4 MG/2ML IJ SOLN
INTRAMUSCULAR | Status: AC
  Filled 2019-12-20: qty 2

## 2019-12-20 MED ORDER — PHENYLEPHRINE 40 MCG/ML (10ML) SYRINGE FOR IV PUSH (FOR BLOOD PRESSURE SUPPORT): 80.0000 ug | PREFILLED_SYRINGE | INTRAVENOUS | Status: DC | PRN

## 2019-12-20 MED ORDER — MORPHINE SULFATE (PF) 0.5 MG/ML IJ SOLN
INTRAMUSCULAR | Status: AC
  Filled 2019-12-20: qty 10

## 2019-12-20 MED ORDER — LIDOCAINE HCL (PF) 1 % IJ SOLN
INTRAMUSCULAR | Status: DC | PRN
  Administered 2019-12-20: 6 mL via EPIDURAL

## 2019-12-20 MED ORDER — DIPHENHYDRAMINE HCL 50 MG/ML IJ SOLN: 12.5000 mg | INTRAMUSCULAR | Status: DC | PRN

## 2019-12-20 MED ORDER — LIDOCAINE HCL (PF) 1 % IJ SOLN: 30.0000 mL | INTRAMUSCULAR | Status: DC | PRN

## 2019-12-20 MED ORDER — FENTANYL CITRATE (PF) 100 MCG/2ML IJ SOLN
INTRAMUSCULAR | Status: AC
  Filled 2019-12-20: qty 2

## 2019-12-20 MED ORDER — SODIUM CHLORIDE 0.9 % IR SOLN
Status: DC | PRN
  Administered 2019-12-20: 1000 mL

## 2019-12-20 MED ORDER — SODIUM CHLORIDE (PF) 0.9 % IJ SOLN
INTRAMUSCULAR | Status: DC | PRN
  Administered 2019-12-20: 12 mL/h via EPIDURAL

## 2019-12-20 MED ORDER — ONDANSETRON HCL 4 MG/2ML IJ SOLN: 4.0000 mg | Freq: Four times a day (QID) | INTRAMUSCULAR | Status: DC | PRN

## 2019-12-20 MED ORDER — MISOPROSTOL 25 MCG QUARTER TABLET
25.0000 ug | ORAL_TABLET | ORAL | Status: AC | PRN
  Administered 2019-12-20 (×2): 25 ug via VAGINAL
  Filled 2019-12-20 (×2): qty 1

## 2019-12-20 MED ORDER — OXYTOCIN-SODIUM CHLORIDE 30-0.9 UT/500ML-% IV SOLN
1.0000 m[IU]/min | INTRAVENOUS | Status: DC
  Administered 2019-12-20 (×2): 2 m[IU]/min via INTRAVENOUS

## 2019-12-20 MED ORDER — FENTANYL-BUPIVACAINE-NACL 0.5-0.125-0.9 MG/250ML-% EP SOLN
12.0000 mL/h | EPIDURAL | Status: DC | PRN
  Filled 2019-12-20: qty 250

## 2019-12-20 SURGICAL SUPPLY — 41 items
BARRIER ADHS 3X4 INTERCEED (GAUZE/BANDAGES/DRESSINGS) ×3 IMPLANT
BENZOIN TINCTURE PRP APPL 2/3 (GAUZE/BANDAGES/DRESSINGS) ×3 IMPLANT
CHLORAPREP W/TINT 26ML (MISCELLANEOUS) ×3 IMPLANT
CLAMP CORD UMBIL (MISCELLANEOUS) IMPLANT
CLOSURE WOUND 1/2 X4 (GAUZE/BANDAGES/DRESSINGS) ×1
CLOTH BEACON ORANGE TIMEOUT ST (SAFETY) ×3 IMPLANT
DRSG OPSITE POSTOP 4X10 (GAUZE/BANDAGES/DRESSINGS) ×3 IMPLANT
ELECT REM PT RETURN 9FT ADLT (ELECTROSURGICAL) ×3
ELECTRODE REM PT RTRN 9FT ADLT (ELECTROSURGICAL) ×1 IMPLANT
EXTRACTOR VACUUM KIWI (MISCELLANEOUS) IMPLANT
EXTRACTOR VACUUM M CUP 4 TUBE (SUCTIONS) IMPLANT
EXTRACTOR VACUUM M CUP 4' TUBE (SUCTIONS)
GLOVE BIO SURGEON STRL SZ7 (GLOVE) ×3 IMPLANT
GLOVE BIOGEL PI IND STRL 7.0 (GLOVE) ×2 IMPLANT
GLOVE BIOGEL PI INDICATOR 7.0 (GLOVE) ×4
GOWN STRL REUS W/TWL LRG LVL3 (GOWN DISPOSABLE) ×6 IMPLANT
HEMOSTAT ARISTA ABSORB 3G PWDR (HEMOSTASIS) ×3 IMPLANT
KIT ABG SYR 3ML LUER SLIP (SYRINGE) IMPLANT
NEEDLE HYPO 25X5/8 SAFETYGLIDE (NEEDLE) IMPLANT
NS IRRIG 1000ML POUR BTL (IV SOLUTION) ×3 IMPLANT
PACK C SECTION WH (CUSTOM PROCEDURE TRAY) ×3 IMPLANT
PAD OB MATERNITY 4.3X12.25 (PERSONAL CARE ITEMS) ×3 IMPLANT
RTRCTR C-SECT PINK 25CM LRG (MISCELLANEOUS) IMPLANT
SPONGE LAP 18X18 RF (DISPOSABLE) ×6 IMPLANT
STRIP CLOSURE SKIN 1/2X4 (GAUZE/BANDAGES/DRESSINGS) ×2 IMPLANT
STRIP CLOSURE SKIN 1X5 (GAUZE/BANDAGES/DRESSINGS) ×2 IMPLANT
STRIP CLOSURE STERI-STRIP 1X5 (GAUZE/BANDAGES/DRESSINGS) ×1
SUT MNCRL 0 VIOLET CTX 36 (SUTURE) ×3 IMPLANT
SUT MONOCRYL 0 CTX 36 (SUTURE) ×6
SUT PLAIN 0 NONE (SUTURE) IMPLANT
SUT PLAIN 2 0 (SUTURE)
SUT PLAIN ABS 2-0 CT1 27XMFL (SUTURE) IMPLANT
SUT VIC AB 0 CT1 27 (SUTURE) ×4
SUT VIC AB 0 CT1 27XBRD ANBCTR (SUTURE) ×2 IMPLANT
SUT VIC AB 2-0 CT1 27 (SUTURE) ×2
SUT VIC AB 2-0 CT1 TAPERPNT 27 (SUTURE) ×1 IMPLANT
SUT VIC AB 4-0 KS 27 (SUTURE) ×3 IMPLANT
SUT VICRYL 0 TIES 12 18 (SUTURE) IMPLANT
TOWEL OR 17X24 6PK STRL BLUE (TOWEL DISPOSABLE) ×3 IMPLANT
TRAY FOLEY W/BAG SLVR 14FR LF (SET/KITS/TRAYS/PACK) IMPLANT
WATER STERILE IRR 1000ML POUR (IV SOLUTION) ×3 IMPLANT

## 2019-12-20 NOTE — Progress Notes (Signed)
RN called to discuss FHT 120s mod variability/ + accels/ + variable decels with UCs. Cat II Cx check by RN, same at 5 cm but descent to -1 per RN and 80 % effaced, reassuring.  Continue to watch, decrease pitocin to 2 mu rate from 4 mu rate.

## 2019-12-20 NOTE — Progress Notes (Addendum)
Virginia Strickland is a 39 y.o. G2P0010 at [redacted]w[redacted]d by ultrasound admitted for induction of labor due to Hydramnios and AMA age 31.Marland Kitchen Unstable lie, but Vx since 37wks  Subjective: Comfortable, no pressure   Objective: BP 118/69    Pulse 65    Temp 98.2 F (36.8 C) (Oral)    Resp 18    Ht 5\' 5"  (1.651 m)    Wt 79.4 kg    SpO2 100%    BMI 29.14 kg/m  FHR: 130 bpm, variability: moderate,  accelerations:  Present,  decelerations:  Absent UC:   regular, every 3-5 minutes, start back pitocin at 2   SVE:   Dilation: Lip/rim Effacement (%): 100 Station: -1 Exam by:: Dr. [email or web address] No change since RN check at 7 pm and my exam at 9 pm.   Assessment / Plan: Protracted active phase at this point, reassess in 1hr and if not change in dilation, proceed with C-section.   Juliene Pina 12/20/2019, 11:08 PM

## 2019-12-20 NOTE — Progress Notes (Signed)
S: Doing well. Feeling some contractions but not painful. Discussed the R/B/A of foley balloon placement for cervical ripening and patient consents to procedure.   O: Vitals:   12/20/19 0800 12/20/19 0808 12/20/19 1147 12/20/19 1212  BP:  115/72 108/70   Pulse:  71 68 83  Resp: 18  17 16   Temp: 97.9 F (36.6 C)  97.7 F (36.5 C)   TempSrc: Oral  Oral   Weight:      Height:       FHT:  FHR: 115 bpm, variability: moderate,  accelerations:  Present,  decelerations:  Absent UC:   irregular, every 1.5-5 minutes SVE:   Dilation: 3 Effacement (%): 50 Station: -3 Exam by:: [email or web address] CNM  Foley balloon placed without difficulty, 60ccs infused into balloon, and taped to leg. Tolerated procedure well.   A / P: Induction of labor due to AMA, polyhydramnios,  progressing well on pitocin  Fetal Wellbeing:  Category I Pain Control:  IV pain meds Anticipated MOD:  NSVD   Dr. Dorisann Frames updated on patient status and plan of care.   Virginia Strickland, CNM, MSN 12/20/2019, 12:17 PM

## 2019-12-20 NOTE — Progress Notes (Signed)
Virginia Strickland is a 39 y.o. G2P0010 at [redacted]w[redacted]d by ultrasound admitted for induction of labor due to Hydramnios and AMA age 41.Marland Kitchen Unstable lie, but Vx since 37wks  Subjective: Back pain  Objective: BP (!) 99/54 Comment: pt on side; assessing  Pulse 72   Temp 97.7 F (36.5 C) (Oral)   Resp 18   Ht 5\' 5"  (1.651 m)   Wt 79.4 kg   BMI 29.14 kg/m  FHT:  FHR: 130 bpm, variability: moderate,  accelerations:  Present,  decelerations:  Absent UC:   regular, every 2-5 minutes, pitocin Cervical balloon in vagina, removed, Controlled AROM with fundal and suprapubic pressure since floating unengaged head. Copious mec stained AF. Android pelvis   SVE:   Dilation: 5 Effacement (%): 70 Station:  (-4) Exam by:: Dr. [email or web address]   Labs: Lab Results  Component Value Date   WBC 5.7 12/20/2019   HGB 13.1 12/20/2019   HCT 40.0 12/20/2019   MCV 97.1 12/20/2019   PLT 195 12/20/2019    Assessment / Plan: IOL, anticipate active labor, cont pitocin as tolerated. Epidural now GBS(-) EFW 7.1/2 lbs., high stn and borderline pelvis, continue to assess descent  FHT catI   12/22/2019 12/20/2019, 2:03 PM

## 2019-12-20 NOTE — Anesthesia Procedure Notes (Signed)
Epidural Patient location during procedure: OB Start time: 12/20/2019 2:25 PM End time: 12/20/2019 2:37 PM  Staffing Anesthesiologist: Atilano Median, DO Performed: anesthesiologist   Preanesthetic Checklist Completed: patient identified, IV checked, site marked, risks and benefits discussed, surgical consent, monitors and equipment checked, pre-op evaluation and timeout performed  Epidural Patient position: sitting Prep: ChloraPrep Patient monitoring: heart rate, continuous pulse ox and blood pressure Approach: midline Location: L3-L4 Injection technique: LOR saline  Needle:  Needle type: Tuohy  Needle gauge: 17 G Needle length: 9 cm Needle insertion depth: 6 cm Catheter type: closed end flexible Catheter size: 20 Guage Catheter at skin depth: 12 cm Test dose: negative and 1.5% lidocaine  Assessment Sensory level: T8 Events: blood not aspirated, injection not painful, no injection resistance and no paresthesia  Additional Notes LOR @ 6  Patient identified. Risks/Benefits/Options discussed with patient including but not limited to bleeding, infection, nerve damage, paralysis, failed block, incomplete pain control, headache, blood pressure changes, nausea, vomiting, reactions to medications, itching and postpartum back pain. Confirmed with bedside nurse the patient's most recent platelet count. Confirmed with patient that they are not currently taking any anticoagulation, have any bleeding history or any family history of bleeding disorders. Patient expressed understanding and wished to proceed. All questions were answered. Sterile technique was used throughout the entire procedure. Please see nursing notes for vital signs. Test dose was given through epidural catheter and negative prior to continuing to dose epidural or start infusion. Warning signs of high block given to the patient including shortness of breath, tingling/numbness in hands, complete motor block, or any  concerning symptoms with instructions to call for help. Patient was given instructions on fall risk and not to get out of bed. All questions and concerns addressed with instructions to call with any issues or inadequate analgesia.    Reason for block:procedure for pain

## 2019-12-20 NOTE — Progress Notes (Signed)
Spoke with CNM Jones re decel at 5.05 AM. Reviewed FHT. Now cat I and RN placed 2nd Cytotec. CNM unable to place foley due to cervix being very posterior, difficult exam Continue close observation.

## 2019-12-20 NOTE — Progress Notes (Signed)
Virginia Strickland is a 39 y.o. G2P0010 at [redacted]w[redacted]d by ultrasound admitted for induction of labor due to Hydramnios and AMA age 3.Marland Kitchen Unstable lie, but Vx since 37wks  Subjective: Comfortable, no pressure   Objective: BP (!) 113/57   Pulse 66   Temp 98.2 F (36.8 C) (Oral)   Resp 18   Ht 5\' 5"  (1.651 m)   Wt 79.4 kg   BMI 29.14 kg/m  FHR: 130 bpm, variability: moderate,  accelerations:  Present,  decelerations:  Absent UC:   regular, every 3-5 minutes, start back pitocin at 2   SVE:   Dilation: Lip/rim Effacement (%): 100 Station: -1 Exam by:: Dr. [email or web address]  Assessment / Plan: Good progress of dilatation, descent still poor, continue exaggerated Sims, start back pitocin at 2 mu and reassess  FHT cat I Pelvis borderline   Juliene Pina 12/20/2019, 9:05 PM

## 2019-12-20 NOTE — Anesthesia Preprocedure Evaluation (Signed)
Anesthesia Evaluation  Patient identified by MRN, date of birth, ID band Patient awake    Airway Mallampati: II  TM Distance: >3 FB Neck ROM: Full    Dental  (+) Teeth Intact   Pulmonary neg pulmonary ROS,    Pulmonary exam normal        Cardiovascular negative cardio ROS   Rhythm:Regular Rate:Normal     Neuro/Psych negative neurological ROS  negative psych ROS   GI/Hepatic negative GI ROS, Neg liver ROS,   Endo/Other  negative endocrine ROS  Renal/GU negative Renal ROS  negative genitourinary   Musculoskeletal negative musculoskeletal ROS (+)   Abdominal (+)  Abdomen: soft. Bowel sounds: normal.  Peds  Hematology   Anesthesia Other Findings   Reproductive/Obstetrics (+) Pregnancy                             Anesthesia Physical Anesthesia Plan  ASA: II  Anesthesia Plan: Epidural   Post-op Pain Management:    Induction:   PONV Risk Score and Plan: 2  Airway Management Planned:   Additional Equipment:   Intra-op Plan:   Post-operative Plan:   Informed Consent:   Plan Discussed with:   Anesthesia Plan Comments: (Lab Results      Component                Value               Date                      WBC                      5.7                 12/20/2019                HGB                      13.1                12/20/2019                HCT                      40.0                12/20/2019                MCV                      97.1                12/20/2019                PLT                      195                 12/20/2019           )        Anesthesia Quick Evaluation

## 2019-12-21 ENCOUNTER — Encounter (HOSPITAL_COMMUNITY): Payer: Self-pay | Admitting: Obstetrics & Gynecology

## 2019-12-21 DIAGNOSIS — O99892 Other specified diseases and conditions complicating childbirth: Secondary | ICD-10-CM | POA: Diagnosis present

## 2019-12-21 DIAGNOSIS — Z98891 History of uterine scar from previous surgery: Secondary | ICD-10-CM

## 2019-12-21 LAB — CBC
HCT: 36 % (ref 36.0–46.0)
Hemoglobin: 11.8 g/dL — ABNORMAL LOW (ref 12.0–15.0)
MCH: 31.8 pg (ref 26.0–34.0)
MCHC: 32.8 g/dL (ref 30.0–36.0)
MCV: 97 fL (ref 80.0–100.0)
Platelets: 161 10*3/uL (ref 150–400)
RBC: 3.71 MIL/uL — ABNORMAL LOW (ref 3.87–5.11)
RDW: 13.9 % (ref 11.5–15.5)
WBC: 13.4 10*3/uL — ABNORMAL HIGH (ref 4.0–10.5)
nRBC: 0 % (ref 0.0–0.2)

## 2019-12-21 LAB — GLUCOSE, CAPILLARY: Glucose-Capillary: 66 mg/dL — ABNORMAL LOW (ref 70–99)

## 2019-12-21 MED ORDER — DIPHENHYDRAMINE HCL 25 MG PO CAPS
25.0000 mg | ORAL_CAPSULE | ORAL | Status: DC | PRN
  Administered 2019-12-21: 25 mg via ORAL
  Filled 2019-12-21: qty 1

## 2019-12-21 MED ORDER — ACETAMINOPHEN 10 MG/ML IV SOLN
1000.0000 mg | Freq: Once | INTRAVENOUS | Status: DC | PRN
  Administered 2019-12-21: 1000 mg via INTRAVENOUS

## 2019-12-21 MED ORDER — KETOROLAC TROMETHAMINE 30 MG/ML IJ SOLN: 30.0000 mg | Freq: Once | INTRAMUSCULAR | Status: DC | PRN

## 2019-12-21 MED ORDER — OXYCODONE HCL 5 MG PO TABS
5.0000 mg | ORAL_TABLET | ORAL | Status: DC | PRN
  Administered 2019-12-22 – 2019-12-24 (×9): 10 mg via ORAL
  Filled 2019-12-21 (×9): qty 2

## 2019-12-21 MED ORDER — NALOXONE HCL 0.4 MG/ML IJ SOLN: 0.4000 mg | INTRAMUSCULAR | Status: DC | PRN

## 2019-12-21 MED ORDER — SENNOSIDES-DOCUSATE SODIUM 8.6-50 MG PO TABS
2.0000 | ORAL_TABLET | ORAL | Status: DC
  Administered 2019-12-22 – 2019-12-23 (×3): 2 via ORAL
  Filled 2019-12-21 (×3): qty 2

## 2019-12-21 MED ORDER — FAMOTIDINE IN NACL 20-0.9 MG/50ML-% IV SOLN
20.0000 mg | Freq: Once | INTRAVENOUS | Status: AC
  Administered 2019-12-21: 20 mg via INTRAVENOUS
  Filled 2019-12-21: qty 50

## 2019-12-21 MED ORDER — SIMETHICONE 80 MG PO CHEW
80.0000 mg | CHEWABLE_TABLET | Freq: Three times a day (TID) | ORAL | Status: DC
  Administered 2019-12-21 – 2019-12-24 (×8): 80 mg via ORAL
  Filled 2019-12-21 (×9): qty 1

## 2019-12-21 MED ORDER — MORPHINE SULFATE (PF) 0.5 MG/ML IJ SOLN
INTRAMUSCULAR | Status: DC | PRN
  Administered 2019-12-21: 3 mg via EPIDURAL

## 2019-12-21 MED ORDER — ACETAMINOPHEN 10 MG/ML IV SOLN
INTRAVENOUS | Status: AC
  Filled 2019-12-21: qty 100

## 2019-12-21 MED ORDER — NALBUPHINE HCL 10 MG/ML IJ SOLN: 5.0000 mg | Freq: Once | INTRAMUSCULAR | Status: DC | PRN

## 2019-12-21 MED ORDER — SCOPOLAMINE 1 MG/3DAYS TD PT72
1.0000 | MEDICATED_PATCH | Freq: Once | TRANSDERMAL | Status: AC
  Administered 2019-12-21: 1.5 mg via TRANSDERMAL

## 2019-12-21 MED ORDER — PRENATAL MULTIVITAMIN CH: 1.0000 | ORAL_TABLET | Freq: Every day | ORAL | Status: DC

## 2019-12-21 MED ORDER — NALOXONE HCL 4 MG/10ML IJ SOLN
1.0000 ug/kg/h | INTRAVENOUS | Status: DC | PRN
  Filled 2019-12-21: qty 5

## 2019-12-21 MED ORDER — ONDANSETRON HCL 4 MG/2ML IJ SOLN
4.0000 mg | Freq: Three times a day (TID) | INTRAMUSCULAR | Status: DC | PRN
  Administered 2019-12-21: 4 mg via INTRAVENOUS
  Filled 2019-12-21: qty 2

## 2019-12-21 MED ORDER — COCONUT OIL OIL: 1.0000 "application " | TOPICAL_OIL | Status: DC | PRN

## 2019-12-21 MED ORDER — MIDAZOLAM HCL 2 MG/2ML IJ SOLN
INTRAMUSCULAR | Status: AC
  Filled 2019-12-21: qty 2

## 2019-12-21 MED ORDER — SODIUM CHLORIDE 0.9% FLUSH: 3.0000 mL | INTRAVENOUS | Status: DC | PRN

## 2019-12-21 MED ORDER — SODIUM CHLORIDE 0.9 % IV SOLN: INTRAVENOUS | Status: DC | PRN

## 2019-12-21 MED ORDER — SIMETHICONE 80 MG PO CHEW
80.0000 mg | CHEWABLE_TABLET | ORAL | Status: DC
  Administered 2019-12-21 – 2019-12-23 (×4): 80 mg via ORAL
  Filled 2019-12-21 (×3): qty 1

## 2019-12-21 MED ORDER — ZOLPIDEM TARTRATE 5 MG PO TABS: 5.0000 mg | ORAL_TABLET | Freq: Every evening | ORAL | Status: DC | PRN

## 2019-12-21 MED ORDER — MIDAZOLAM HCL 2 MG/2ML IJ SOLN
INTRAMUSCULAR | Status: DC | PRN
  Administered 2019-12-21: 1 mg via INTRAVENOUS

## 2019-12-21 MED ORDER — ONDANSETRON HCL 4 MG/2ML IJ SOLN
INTRAMUSCULAR | Status: DC | PRN
  Administered 2019-12-21: 4 mg via INTRAVENOUS

## 2019-12-21 MED ORDER — DEXAMETHASONE SODIUM PHOSPHATE 4 MG/ML IJ SOLN
INTRAMUSCULAR | Status: DC | PRN
  Administered 2019-12-21: 4 mg via INTRAVENOUS

## 2019-12-21 MED ORDER — ACETAMINOPHEN 325 MG PO TABS
650.0000 mg | ORAL_TABLET | ORAL | Status: DC | PRN
  Administered 2019-12-22 – 2019-12-24 (×10): 650 mg via ORAL
  Filled 2019-12-21 (×10): qty 2

## 2019-12-21 MED ORDER — PHENYLEPHRINE HCL (PRESSORS) 10 MG/ML IV SOLN
INTRAVENOUS | Status: DC | PRN
  Administered 2019-12-21 (×3): 80 ug via INTRAVENOUS

## 2019-12-21 MED ORDER — PRENATAL MULTIVITAMIN CH
1.0000 | ORAL_TABLET | Freq: Every day | ORAL | Status: DC
  Administered 2019-12-22 – 2019-12-24 (×3): 1 via ORAL
  Filled 2019-12-21 (×2): qty 1

## 2019-12-21 MED ORDER — OXYTOCIN-SODIUM CHLORIDE 30-0.9 UT/500ML-% IV SOLN: 2.5000 [IU]/h | INTRAVENOUS | Status: AC

## 2019-12-21 MED ORDER — WITCH HAZEL-GLYCERIN EX PADS: 1.0000 "application " | MEDICATED_PAD | CUTANEOUS | Status: DC | PRN

## 2019-12-21 MED ORDER — NALBUPHINE HCL 10 MG/ML IJ SOLN: 5.0000 mg | INTRAMUSCULAR | Status: DC | PRN

## 2019-12-21 MED ORDER — FENTANYL CITRATE (PF) 100 MCG/2ML IJ SOLN: 25.0000 ug | INTRAMUSCULAR | Status: DC | PRN

## 2019-12-21 MED ORDER — ACETAMINOPHEN 500 MG PO TABS
1000.0000 mg | ORAL_TABLET | Freq: Four times a day (QID) | ORAL | Status: AC
  Administered 2019-12-21: 1000 mg via ORAL
  Filled 2019-12-21 (×2): qty 2

## 2019-12-21 MED ORDER — OXYTOCIN-SODIUM CHLORIDE 30-0.9 UT/500ML-% IV SOLN
INTRAVENOUS | Status: DC | PRN
  Administered 2019-12-21: 30 [IU] via INTRAVENOUS

## 2019-12-21 MED ORDER — LACTATED RINGERS IV SOLN: INTRAVENOUS | Status: DC

## 2019-12-21 MED ORDER — DIBUCAINE (PERIANAL) 1 % EX OINT: 1.0000 "application " | TOPICAL_OINTMENT | CUTANEOUS | Status: DC | PRN

## 2019-12-21 MED ORDER — LACTATED RINGERS IV BOLUS
500.0000 mL | Freq: Once | INTRAVENOUS | Status: AC
  Administered 2019-12-21: 500 mL via INTRAVENOUS

## 2019-12-21 MED ORDER — KETOROLAC TROMETHAMINE 30 MG/ML IJ SOLN
30.0000 mg | Freq: Four times a day (QID) | INTRAMUSCULAR | Status: AC | PRN
  Administered 2019-12-21: 30 mg via INTRAVENOUS
  Filled 2019-12-21: qty 1

## 2019-12-21 MED ORDER — AMMONIA AROMATIC IN INHA
RESPIRATORY_TRACT | Status: AC
  Filled 2019-12-21: qty 10

## 2019-12-21 MED ORDER — CEFAZOLIN SODIUM-DEXTROSE 2-3 GM-%(50ML) IV SOLR
INTRAVENOUS | Status: DC | PRN
  Administered 2019-12-21: 2 g via INTRAVENOUS

## 2019-12-21 MED ORDER — FENTANYL CITRATE (PF) 100 MCG/2ML IJ SOLN
INTRAMUSCULAR | Status: DC | PRN
  Administered 2019-12-21 (×2): 50 ug via INTRAVENOUS

## 2019-12-21 MED ORDER — SIMETHICONE 80 MG PO CHEW
80.0000 mg | CHEWABLE_TABLET | ORAL | Status: DC | PRN
  Administered 2019-12-21: 80 mg via ORAL

## 2019-12-21 MED ORDER — MENTHOL 3 MG MT LOZG: 1.0000 | LOZENGE | OROMUCOSAL | Status: DC | PRN

## 2019-12-21 MED ORDER — LACTATED RINGERS IV SOLN: INTRAVENOUS | Status: DC | PRN

## 2019-12-21 MED ORDER — PHENYLEPHRINE 40 MCG/ML (10ML) SYRINGE FOR IV PUSH (FOR BLOOD PRESSURE SUPPORT)
PREFILLED_SYRINGE | INTRAVENOUS | Status: AC
  Filled 2019-12-21: qty 10

## 2019-12-21 MED ORDER — DIPHENHYDRAMINE HCL 50 MG/ML IJ SOLN: 12.5000 mg | INTRAMUSCULAR | Status: DC | PRN

## 2019-12-21 MED ORDER — TETANUS-DIPHTH-ACELL PERTUSSIS 5-2.5-18.5 LF-MCG/0.5 IM SUSP: 0.5000 mL | Freq: Once | INTRAMUSCULAR | Status: DC

## 2019-12-21 MED ORDER — DIPHENHYDRAMINE HCL 25 MG PO CAPS
25.0000 mg | ORAL_CAPSULE | Freq: Four times a day (QID) | ORAL | Status: DC | PRN
  Administered 2019-12-22: 25 mg via ORAL
  Filled 2019-12-21: qty 1

## 2019-12-21 MED ORDER — FAMOTIDINE 20 MG PO TABS
20.0000 mg | ORAL_TABLET | Freq: Every day | ORAL | Status: DC
  Administered 2019-12-21 – 2019-12-24 (×4): 20 mg via ORAL
  Filled 2019-12-21 (×4): qty 1

## 2019-12-21 MED ORDER — LIDOCAINE-EPINEPHRINE (PF) 2 %-1:200000 IJ SOLN
INTRAMUSCULAR | Status: DC | PRN
  Administered 2019-12-21: 10 mL via EPIDURAL

## 2019-12-21 MED ORDER — KETOROLAC TROMETHAMINE 30 MG/ML IJ SOLN: 30.0000 mg | Freq: Four times a day (QID) | INTRAMUSCULAR | Status: AC | PRN

## 2019-12-21 MED ORDER — SCOPOLAMINE 1 MG/3DAYS TD PT72
MEDICATED_PATCH | TRANSDERMAL | Status: AC
  Filled 2019-12-21: qty 1

## 2019-12-21 MED ORDER — IBUPROFEN 600 MG PO TABS
600.0000 mg | ORAL_TABLET | Freq: Four times a day (QID) | ORAL | Status: DC | PRN
  Filled 2019-12-21 (×2): qty 1

## 2019-12-21 MED ORDER — FENTANYL CITRATE (PF) 100 MCG/2ML IJ SOLN
INTRAMUSCULAR | Status: AC
  Filled 2019-12-21: qty 2

## 2019-12-21 NOTE — Progress Notes (Signed)
[employer] notified of all blood pressures. Patient is up and walking in room and is not dizzy.

## 2019-12-21 NOTE — Progress Notes (Signed)
Pressure dressing applied verbal order per Dr. Juliene Pina

## 2019-12-21 NOTE — Op Note (Signed)
Cesarean Section Procedure Note   Virginia Strickland [facility]  12/21/2019  Indications: Arrest of dilatation and descent  AMA, labor induction, progressed to 9.5 cm with no change after that for over 5 hours, so C-section was advised.   Pre-operative Diagnosis: Arrest of dilatation and descent .   Post-operative Diagnosis: Same   Surgeon: Virginia Evans, MD   Assistants: Virginia Strickland, CNM   Anesthesia: epidural   Procedure Details:  The patient was seen in the Labor Room. The risks, benefits, complications, treatment options, and expected outcomes were discussed with the patient. The patient concurred with the proposed plan, giving informed consent. identified as Virginia Strickland and the procedure verified as C-Section Delivery. A Time Out was held and the above information confirmed.  After induction of anesthesia, the patient was draped and prepped in the usual sterile manner, foley was draining urine well but only small amount of urine noted.  A pfannenstiel incision was made and carried down through the subcutaneous tissue to the fascia. Fascial incision was made and extended transversely. The fascia was separated from the underlying rectus tissue superiorly and inferiorly. The peritoneum was identified and entered. Peritoneal incision was extended longitudinally. Alexis-O retractor placed. Uterine anterior surface was inflamed, possible prior adhesions that broke off as uterus grew in pregnancy, especially left lateral and anterior aspect. Also lower segment noted to have large sinuses. The utero-vesical peritoneal reflection was incised transversely and the bladder flap was bluntly freed from the lower uterine segment. I clamped a large vessel traversing across lower segment with Hemostat and then a low transverse uterine incision was made. Meconium stained fluid noted. Delivered from cephalic presentation was a female  infant with vigorous cry at 12.47 AM on 12/21/19.  Apgar scores of 8  at one minute and 9 at five minutes. Delayed cord clamping done at 1 minute and baby handed to NICU team in attendance. Cord ph was not sent. Cord blood was obtained for evaluation. The placenta was removed Intact and appeared normal. The uterine outline, tubes and ovaries appeared normal. The uterine incision was closed with running locked sutures of Monocryl. A second imbricating layer sutured. Lower segment sinus was still bleeding and I took three separate figure of eight stitches to control bleeding. Hemostasis was observed. Left anterior aspect of the uterus was oozing due to inflammation or prior adhesions, so Arista was sprayed in it. Interceed placed on hysterotomy closure as bladder stayed high, though it was draining. Hemostasis was observed. Alexis retractor removed. Peritoneal closure done with 2-0 Vicryl.  The fascia was then reapproximated with running sutures of 0Vicryl. The subcuticular closure was performed using 2-0plain gut. The skin was closed with 4-0Vicryl. Steristrips and Honeycomb dressing placed.  Instrument, sponge, and needle counts were correct prior the abdominal closure and were correct at the conclusion of the case.   Findings: Uterine anterior surface with inflamed areas, possible prior adhesions breaking in pregnancy. Lower segment with large sinuses and bled in delivery and needing additional sutures. Baby Girl, delivered cephalic via Kerr hysterotomy and closure done in 2 layers.  Nl tubes and ovaries   Estimated Blood Loss: 1450 cc  Total IV Fluids: LR 3800 ml   Urine Output: 150CC OF pink colored urine clearing at the end of surgery  Specimens: placenta, cord blood   Complications: no complications  Disposition: PACU - hemodynamically stable.   Maternal Condition: stable   Baby condition / location:  Couplet care / Skin to Skin  Attending Attestation: I performed the  procedure.   Signed: Surgeon(s): Virginia Evans, MD

## 2019-12-21 NOTE — Progress Notes (Signed)
POSTOPERATIVE DAY # 0 S/P Primary LTCS for arrest of dilatation and descent, baby girl "Amira"  S:         Reports feeling very tired. Dozing in and out of sleep during visit.              Tolerating po intake / reports nausea and vomiting this morning and has only had juice to drink since emesis; received 1 dose of Zofran/ no flatus / no BM  Denies dizziness, SOB, or CP             Bleeding is moderate             Pain controlled with Toradol             Up with assistance - reports having some numbness in right upper thigh. Denies problems with walking. Foley catheter removed at 12pm and has not voided yet   Newborn breast feeding with formula supplementation, having difficulty with latch and no colostrum    O:  VS: BP (!) 93/56 (BP Location: Left Arm) Comment: patient not dizzy   Pulse 90    Temp 98.8 F (37.1 C) (Oral)    Resp 18    Ht 5\' 5"  (1.651 m)    Wt 79.4 kg    SpO2 97%    Breastfeeding Unknown    BMI 29.14 kg/m    LABS:              Recent Labs    12/20/19 0116 12/21/19 0300  WBC 5.7 13.4*  HGB 13.1 11.8*  PLT 195 161               Bloodtype: --/--/O POS (10/21 0048)  Rubella: Immune (03/31 0000)                                             I&O: Intake/Output      10/22 0701 - 10/23 0700   P.O. 10   I.V. (mL/kg) 500 (6.3)   Total Intake(mL/kg) 510 (6.4)   Urine (mL/kg/hr) 1400 (1.3)   Emesis/NG output 800   Blood    Total Output 2200   Net -1690                    Physical Exam:             Alert and Oriented X3  Lungs: Clear and unlabored  Heart: regular rate and rhythm / no murmurs  Abdomen: soft, non-tender, moderate gaseous distention; hypo active bowel sounds in all quadrants              Fundus: firm, non-tender, U-2             Dressing: pressure dressing c/d/i; honeycomb dressing with bloody drainage noted              Incision:  approximated with sutures /unable to visualize   Perineum: intact  Lochia: small rubra on pad  Extremities: no edema,  no calf pain or tenderness  A/P:    POD # 0 S/P Primary LTCS             Mild ABL anemia    - asymptomatic   Lactation support PRN  Encouraged void trial every 2 hours to avoid over distention  Remove pressure dressing at 24 hours   Routine postoperative care  Encouraged to rest when baby rests  Warm liquids and ambulation to promote bowel motility   Continue current care   Carlean Jews, MSN, CNM [facility]

## 2019-12-21 NOTE — Anesthesia Postprocedure Evaluation (Signed)
Anesthesia Post Note  Patient: Virginia Strickland  Procedure(s) Performed: CESAREAN SECTION (N/A )     Patient location during evaluation: Mother Baby Anesthesia Type: Epidural Level of consciousness: awake and alert and oriented Pain management: satisfactory to patient Vital Signs Assessment: post-procedure vital signs reviewed and stable Respiratory status: respiratory function stable and spontaneous breathing Cardiovascular status: blood pressure returned to baseline Postop Assessment: no headache, no backache, spinal receding, patient able to bend at knees and adequate PO intake Anesthetic complications: no   No complications documented.  Last Vitals:  Vitals:   12/21/19 0335 12/21/19 0424  BP: 108/63 98/66  Pulse: 72 86  Resp:    Temp: 37.7 C 36.7 C  SpO2: 96%     Last Pain:  Vitals:   12/21/19 0424  TempSrc: Oral  PainSc: 0-No pain   Pain Goal: Patients Stated Pain Goal: 0 (12/20/19 0725)                 Karleen Dolphin

## 2019-12-21 NOTE — Progress Notes (Signed)
Bladder scanned patient . Only 109cc . Carlean Jews notified of patient not being able to void after 6 hours of catheter removal. Will bolus per order and tell the Night RN to do another bladder scan by 1930 or 2000 . Cath if 200 or more resulted in bladder scan result.

## 2019-12-21 NOTE — Lactation Note (Addendum)
This note was copied from a baby's chart. Lactation Consultation Note  Patient Name: Girl Kaityln Kallstrom [id number] Date: 12/21/2019   Mother and FOB sleeping.  Lactation to visit later today.  Noted baby only receiving formula so far today.   Wanted to confirm feeding choice.      Maternal Data    Feeding    LATCH Score                   Interventions    Lactation Tools Discussed/Used     Consult Status      Hardie Pulley 12/21/2019, 1:26 PM

## 2019-12-21 NOTE — Progress Notes (Signed)
[name] updated on patients status and vitals.

## 2019-12-21 NOTE — Transfer of Care (Signed)
Immediate Anesthesia Transfer of Care Note  Patient: Virginia Strickland  Procedure(s) Performed: CESAREAN SECTION (N/A )  Patient Location: PACU  Anesthesia Type:Epidural  Level of Consciousness: awake, alert  and patient cooperative  Airway & Oxygen Therapy: Patient Spontanous Breathing  Post-op Assessment: Report given to RN and Post -op Vital signs reviewed and stable  Post vital signs: Reviewed and stable  Last Vitals:  Vitals Value Taken Time  BP 124/79 12/21/19 0155  Temp    Pulse 100 12/21/19 0201  Resp 22 12/21/19 0201  SpO2 100 % 12/21/19 0201  Vitals shown include unvalidated device data.  Last Pain:  Vitals:   12/20/19 2330  TempSrc: Oral  PainSc:       Patients Stated Pain Goal: 0 (12/20/19 0725)  Complications: No complications documented.

## 2019-12-21 NOTE — Lactation Note (Addendum)
This note was copied from a baby's chart. Lactation Consultation Note  Patient Name: Virginia Strickland Date: 12/21/2019 Reason for consult: Initial assessment;1st time breastfeeding;Primapara;Difficult latch LC to room for initial consult. Mother has been bottle feeding today. LC observed mother to have dense areaola/nipple tissue with R inverted nipple. LC assisted with latch attempt and used 47mm shield. Few sucks observed but no sustained latch. Provided hand pump and shells to evert nipples. Left baby sts. Mother does not have a pump at home but will contact [facility] for personal pump to use p d/c prn. Basic bf ed provided: feeding with cues, positioning, bf support p d/c, avoiding formula / artificial nipples unless medically indicated. Relayed detail to RN. RN to set up electric pump. Will plan f/u care.  Maternal Data Formula Feeding for Exclusion: Yes Reason for exclusion: Mother's choice to formula feed on admision Has patient been taught Hand Expression?: Yes Does the patient have breastfeeding experience prior to this delivery?: No  Feeding Feeding Type: Formula  LATCH Score Latch: Too sleepy or reluctant, no latch achieved, no sucking elicited.  Audible Swallowing: None  Type of Nipple: Inverted  Comfort (Breast/Nipple): Soft / non-tender  Hold (Positioning): Full assist, staff holds infant at breast  LATCH Score: 2  Interventions Interventions: Breast feeding basics reviewed;Assisted with latch;Skin to skin;Hand express;Reverse pressure;Adjust position;Hand pump;Shells;Position options;Support pillows  Lactation Tools Discussed/Used Tools: Shells;Nipple Shields Nipple shield size: 20 Shell Type: Inverted   Consult Status Consult Status: Follow-up Date: 12/22/19 Follow-up type: In-patient    Elder Negus 12/21/2019, 5:31 PM

## 2019-12-21 NOTE — Progress Notes (Signed)
Patient up in chair. Told them to call for help so she wont fall before getting back into bed. Tolerated walking well. Told to use her Incentive spirometer. Plan of care told.

## 2019-12-21 NOTE — Progress Notes (Signed)
Patient told to drink some more fluids to help her void. She tried to void but was not ready. Patient encourage to ambulate halls.

## 2019-12-21 NOTE — Progress Notes (Signed)
Provider called for RN to in/out cath patient. Pt since urinated . Provider called back and notified; also notified of BP 87/53, asymptomatic. No new orders, provider states she will come see patient later tonight.

## 2019-12-21 NOTE — Progress Notes (Signed)
Patient offered tylenol and narcotic and she wants to wait. Nausea is better per patient. She did eat some dinner.

## 2019-12-22 LAB — CBC
HCT: 26.4 % — ABNORMAL LOW (ref 36.0–46.0)
Hemoglobin: 8.7 g/dL — ABNORMAL LOW (ref 12.0–15.0)
MCH: 32 pg (ref 26.0–34.0)
MCHC: 33 g/dL (ref 30.0–36.0)
MCV: 97.1 fL (ref 80.0–100.0)
Platelets: 148 10*3/uL — ABNORMAL LOW (ref 150–400)
RBC: 2.72 MIL/uL — ABNORMAL LOW (ref 3.87–5.11)
RDW: 14.4 % (ref 11.5–15.5)
WBC: 11.9 10*3/uL — ABNORMAL HIGH (ref 4.0–10.5)
nRBC: 0 % (ref 0.0–0.2)

## 2019-12-22 MED ORDER — POLYSACCHARIDE IRON COMPLEX 150 MG PO CAPS
150.0000 mg | ORAL_CAPSULE | Freq: Every day | ORAL | Status: DC
  Administered 2019-12-23 – 2019-12-24 (×2): 150 mg via ORAL
  Filled 2019-12-22 (×2): qty 1

## 2019-12-22 MED ORDER — MAGNESIUM OXIDE 400 (241.3 MG) MG PO TABS
400.0000 mg | ORAL_TABLET | Freq: Two times a day (BID) | ORAL | Status: DC
  Administered 2019-12-22 – 2019-12-24 (×5): 400 mg via ORAL
  Filled 2019-12-22 (×5): qty 1

## 2019-12-22 NOTE — Lactation Note (Signed)
This note was copied from a baby's chart. Lactation Consultation Note  Patient Name: Virginia Strickland [id number] Date: 12/22/2019   Parents refused stick interpreter. [name] now 13 hours old.  Dad reports he has been interpreter..  Mom reports she has dad breastfed to day.  Mom reports she has no milk.   Urged mom to always breastfeed first, massage hand express and pump and then give formula if they feel she needs it.  Mom asked if we could breastfeed now.  Infant sleepy and will not open.  Recently fed formula. Reemphasized breastfeeding and pumping to have healthy supply for later.  Urged parents to call lactation as needed.  Maternal Data    Feeding    LATCH Score                   Interventions    Lactation Tools Discussed/Used     Consult Status      Virginia Strickland Virginia Strickland 12/22/2019, 7:11 PM

## 2019-12-22 NOTE — Progress Notes (Signed)
POD# 1  S: Pt notes pain suboptimally controlled w/ po meds- meds only last a short time. Pain in mid abdomen, no incision pain, minimal lochia, nl void at this time though intially took some time to void, out of bed w/o dizziness or chest pain, emesis x 2 yesterday, none today, only tolerated tea this am, not hungry, no flatus   Vitals:   12/21/19 0835 12/21/19 1215 12/21/19 1602 12/21/19 2134  BP: 99/86 92/65 (!) 93/56 (!) 87/53  Pulse: 90   88  Resp:  18    Temp:  98.8 F (37.1 C)  98.3 F (36.8 C)  TempSrc:  Oral  Oral  SpO2:   97% 100%  Weight:      Height:        Gen: well appearing Abd: soft, distended with tympany, approp tender, fundus below umbilicus, NT Inc: C/D, pressure dressing in placed and unable to see incision LE: tr edema, NT  CBC    Component Value Date/Time   WBC 11.9 (H) 12/22/2019 0504   RBC 2.72 (L) 12/22/2019 0504   HGB 8.7 (L) 12/22/2019 0504   HCT 26.4 (L) 12/22/2019 0504   PLT 148 (L) 12/22/2019 0504   MCV 97.1 12/22/2019 0504   MCH 32.0 12/22/2019 0504   MCHC 33.0 12/22/2019 0504   RDW 14.4 12/22/2019 0504   CBC Latest Ref Rng & Units 12/22/2019 12/21/2019 12/20/2019  WBC 4.0 - 10.5 K/uL 11.9(H) 13.4(H) 5.7  Hemoglobin 12.0 - 15.0 g/dL 4.6(K) 11.8(L) 13.1  Hematocrit 36 - 46 % 26.4(L) 36.0 40.0  Platelets 150 - 400 K/uL 148(L) 161 195     A/P: POD#  1 s/p PCS for arrest with ABL anemia and likely developing ileus - post-op. Doing overall well though anemic and awaiting return of bowel function. Advance diet slowly, expect improved pain with improved bowel function, encourage ambulation. Start iron tomorrow.   Lendon Colonel 12/22/2019 12:11 PM

## 2019-12-23 LAB — CBC WITH DIFFERENTIAL/PLATELET
Abs Immature Granulocytes: 0.07 10*3/uL (ref 0.00–0.07)
Basophils Absolute: 0 10*3/uL (ref 0.0–0.1)
Basophils Relative: 0 %
Eosinophils Absolute: 0.1 10*3/uL (ref 0.0–0.5)
Eosinophils Relative: 1 %
HCT: 27.8 % — ABNORMAL LOW (ref 36.0–46.0)
Hemoglobin: 9.3 g/dL — ABNORMAL LOW (ref 12.0–15.0)
Immature Granulocytes: 1 %
Lymphocytes Relative: 14 %
Lymphs Abs: 1.4 10*3/uL (ref 0.7–4.0)
MCH: 32.3 pg (ref 26.0–34.0)
MCHC: 33.5 g/dL (ref 30.0–36.0)
MCV: 96.5 fL (ref 80.0–100.0)
Monocytes Absolute: 0.5 10*3/uL (ref 0.1–1.0)
Monocytes Relative: 5 %
Neutro Abs: 7.5 10*3/uL (ref 1.7–7.7)
Neutrophils Relative %: 79 %
Platelets: 179 10*3/uL (ref 150–400)
RBC: 2.88 MIL/uL — ABNORMAL LOW (ref 3.87–5.11)
RDW: 14.4 % (ref 11.5–15.5)
WBC: 9.5 10*3/uL (ref 4.0–10.5)
nRBC: 0 % (ref 0.0–0.2)

## 2019-12-23 MED ORDER — INFLUENZA VAC SPLIT QUAD 0.5 ML IM SUSY
0.5000 mL | PREFILLED_SYRINGE | INTRAMUSCULAR | Status: AC
  Administered 2019-12-24: 0.5 mL via INTRAMUSCULAR
  Filled 2019-12-23: qty 0.5

## 2019-12-23 NOTE — Progress Notes (Signed)
POD# 2  S: Pt notes continued pain, some help w/ po meds. Flatus x 1.  minimal lochia, nl void, out of bed w/ some dizziness but no chest pain, tol reg po but appetite low.  Vitals:   12/21/19 2134 12/22/19 1544 12/22/19 2124 12/23/19 0542  BP: (!) 87/53 107/71 95/74 99/80   Pulse: 88 90 98 87  Resp:  18 18 18   Temp: 98.3 F (36.8 C) 98.4 F (36.9 C) 98.5 F (36.9 C)   TempSrc: Oral Oral Oral Oral  SpO2: 100% 100%  99%  Weight:      Height:        Gen: well appearing CV: RRR Pulm: CTAB Abd: soft distension with tympany, approp tender, fundus below umbilicus, NT Inc: well approximated, old wet staining on bandage LE: tr edema, NT  CBC    Component Value Date/Time   WBC 9.5 12/23/2019 0650   RBC 2.88 (L) 12/23/2019 0650   HGB 9.3 (L) 12/23/2019 0650   HCT 27.8 (L) 12/23/2019 0650   PLT 179 12/23/2019 0650   MCV 96.5 12/23/2019 0650   MCH 32.3 12/23/2019 0650   MCHC 33.5 12/23/2019 0650   RDW 14.4 12/23/2019 0650   LYMPHSABS 1.4 12/23/2019 0650   MONOABS 0.5 12/23/2019 0650   EOSABS 0.1 12/23/2019 0650   BASOSABS 0.0 12/23/2019 0650    A/P: POD#  2 s/p PCS - post-op. Doing well.  - anemia, start iron. CBC stable. Pt notes dizziness with standing. Cont to watch closely. - Has started with flatus, still with distension and tympany. Expect improvement tomorrow  12/25/2019 12/23/2019 10:22 AM

## 2019-12-23 NOTE — Lactation Note (Signed)
This note was copied from a baby's chart. Lactation Consultation Note  Patient Name: Virginia Strickland [id number]'P Date: 12/23/2019  [name] now 80 hours old.  Moms breast are full and she is leaking.  Showed me her shirt.   Attempted to do some hand expression and manual pumping to soften areolas to try and get infant latched.  It has been about three and a half hours since infant ate. Softened them some however mom still has a significant amount of edema in nipple/areolar complex.  Asked mom if we could wake baby Virginia up and try to feed her.  Mom agreed.  Infant unable to latch and attempts mom reports are painful.  Assisted with 20 mm nipple shield however infant is compressing her nipple so switched to 24 mm nipple shield.  Mom reports more comfort and nipple not misshapen.  Milk in nipple shield.  After about 10 minutes of breastfeeding mom reports she feels they are good.  Explained how infant should not smack on the shield.  Showed how to apply it several times.  Showed mom how her cheeks and chin should touch breast and the dome with her mouth all the way in her mouth.   There is somewhat of a language barrier however parents reported they do not want an interpreter.  Dad asked if she will always have to use the nipple shield.  LC explained that it is a tool to assist with latching at this time.  Mom has short/small (bifurcated left nipple-did not observe right) nipples that right now do not evert at all with stimulation.   Left mom and baby breastfeeding.   Urged to call lactation as needed.  Discussed to feed back the small amount of milk we got with hand expression and pumping.  Reviewed how baby will want to eat more often transitioning to breasts.  Urged parents to try and feed more often and watch for early cues.  Dad asked about pumping with DEBP.Marland Kitchen Explained as long as baby eating well and mom felt comfortable past breastfeeds that there is no need to pump.  Urged them to  follow up with outpatient lactation   Maternal Data    Feeding    LATCH Score                   Interventions    Lactation Tools Discussed/Used     Consult Status      Neomia Dear 12/23/2019, 5:53 PM

## 2019-12-24 MED ORDER — POLYSACCHARIDE IRON COMPLEX 150 MG PO CAPS: 150.0000 mg | ORAL_CAPSULE | Freq: Every day | ORAL | 1 refills | Status: DC

## 2019-12-24 MED ORDER — MAGNESIUM OXIDE 400 (241.3 MG) MG PO TABS
400.0000 mg | ORAL_TABLET | Freq: Two times a day (BID) | ORAL | 1 refills | Status: DC
Start: 2019-12-24 — End: 2023-03-10

## 2019-12-24 MED ORDER — ACETAMINOPHEN 325 MG PO TABS: 650.0000 mg | ORAL_TABLET | ORAL | 1 refills | Status: DC | PRN

## 2019-12-24 MED ORDER — ENSURE MAX PROTEIN PO LIQD: 11.0000 [oz_av] | Freq: Every day | ORAL | 2 refills | Status: DC

## 2019-12-24 MED ORDER — IBUPROFEN 600 MG PO TABS: 600.0000 mg | ORAL_TABLET | Freq: Four times a day (QID) | ORAL | 0 refills | Status: DC | PRN

## 2019-12-24 MED ORDER — OXYCODONE HCL 5 MG PO TABS
5.0000 mg | ORAL_TABLET | ORAL | 0 refills | Status: DC | PRN
Start: 2019-12-24 — End: 2023-03-10

## 2019-12-24 MED ORDER — ENSURE MAX PROTEIN PO LIQD
11.0000 [oz_av] | Freq: Every day | ORAL | Status: DC
  Filled 2019-12-24: qty 330

## 2019-12-24 MED ORDER — OXYCODONE HCL 5 MG PO TABS
5.0000 mg | ORAL_TABLET | ORAL | Status: DC | PRN
  Administered 2019-12-24: 10 mg via ORAL
  Filled 2019-12-24: qty 2

## 2019-12-24 NOTE — Discharge Summary (Signed)
OB Discharge Summary  Patient Name: Virginia Strickland DOB: 1980-12-26 MRN: [id number]  Date of admission: 12/20/2019 Delivering provider: MODY, VAISHALI   Admitting diagnosis: Encounter for induction of labor [Z34.90] Delivery by emergency cesarean [O99.892] Intrauterine pregnancy: [redacted]w[redacted]d     Secondary diagnosis: Patient Active Problem List   Diagnosis Date Noted   Failed induction of labor, FTP 12/21/2019   Status post primary low transverse cesarean section 10/22 12/21/2019   Postpartum care following cesarean delivery 10/22 12/21/2019   Delivery by emergency cesarean 12/21/2019   Encounter for induction of labor 12/20/2019   Positive QuantiFERON-TB Gold test 08/14/2019    Date of discharge: 12/24/2019   Discharge diagnosis: Principal Problem:   Postpartum care following cesarean delivery 10/22 Active Problems:   Encounter for induction of labor   Failed induction of labor, FTP   Status post primary low transverse cesarean section 10/22   Delivery by emergency cesarean                                                              Post partum procedures:None  Augmentation: AROM, Pitocin, Cytotec and IP Foley Pain control: Epidural  Laceration:None  Episiotomy:None  Complications: None  Hospital course:  Induction of Labor With Cesarean Section   39 y.o. yo G2P1011 at [redacted]w[redacted]d was admitted to the hospital 12/20/2019 for induction of labor. Patient had a labor course significant for arrest of dilatation and descent. The patient went for cesarean section due to Arrest of Dilation and Arrest of Descent. Delivery details are as follows: Membrane Rupture Time/Date: 1:55 PM ,12/20/2019   Delivery Method:C-Section, Low Transverse  Details of operation can be found in separate operative Note.  Patient had an uncomplicated postpartum course. She is ambulating, tolerating a regular diet, passing flatus, and urinating well.  Patient is discharged home in stable condition on  12/24/19.      Newborn Data: Birth date:12/21/2019  Birth time:12:47 AM  Gender:Female  Living status:Living  Apgars:8 ,9  Weight:3320 g                                 Physical exam  Vitals:   12/23/19 0542 12/23/19 1500 12/23/19 2125 12/24/19 0520  BP: 99/80 112/79 100/67 106/64  Pulse: 87 88 71 89  Resp: 18 18 18 18   Temp:  97.6 F (36.4 C) 98 F (36.7 C) 97.9 F (36.6 C)  TempSrc: Oral Oral Oral Oral  SpO2: 99%   100%  Weight:      Height:       General: alert, cooperative and no distress Lochia: appropriate Uterine Fundus: firm Incision: Healing well with no significant drainage, Dressing is clean, dry, and intact DVT Evaluation: No evidence of DVT seen on physical exam. No significant calf/ankle edema. Labs: Lab Results  Component Value Date   WBC 9.5 12/23/2019   HGB 9.3 (L) 12/23/2019   HCT 27.8 (L) 12/23/2019   MCV 96.5 12/23/2019   PLT 179 12/23/2019   No flowsheet data found. Edinburgh Postnatal Depression Scale Screening Tool 12/23/2019 12/21/2019  I have been able to laugh and see the funny side of things. (No Data) (No Data)   Vaccines: TDaP UTD         Flu  Offered at discharge         COVID-19   ???  Discharge instructions:  per After Visit Summary and [facility] booklet  After Visit Meds:  Allergies as of 12/24/2019   No Known Allergies     Medication List    STOP taking these medications   famotidine 20 MG tablet Commonly known as: PEPCID     TAKE these medications   acetaminophen 325 MG tablet Commonly known as: TYLENOL Take 2 tablets (650 mg total) by mouth every 4 (four) hours as needed for mild pain (temperature > 101.5.).   Ensure Max Protein Liqd Take 330 mLs (11 oz total) by mouth daily.   ibuprofen 600 MG tablet Commonly known as: ADVIL Take 1 tablet (600 mg total) by mouth every 6 (six) hours as needed for moderate pain.   iron polysaccharides 150 MG capsule Commonly known as: Ferrex 150 Take 1 capsule (150 mg  total) by mouth daily.   magnesium oxide 400 (241.3 Mg) MG tablet Commonly known as: MAG-OX Take 1 tablet (400 mg total) by mouth 2 (two) times daily.   oxyCODONE 5 MG immediate release tablet Commonly known as: Oxy IR/ROXICODONE Take 1-2 tablets (5-10 mg total) by mouth every 4 (four) hours as needed for moderate pain.   prenatal multivitamin Tabs tablet Take 1 tablet by mouth daily.      Diet: routine diet  Activity: Advance as tolerated. Pelvic rest for 6 weeks.   Newborn Data: Live born female  Birth Weight: 7 lb 5.1 oz (3320 g) APGAR: 8, 9  Newborn Delivery   Birth date/time: 12/21/2019 00:47:00 Delivery type: C-Section, Low Transverse Trial of labor: Yes C-section categorization: Primary       Named Amira Baby Feeding: Bottle and Breast Disposition:home with mother  Delivery Report:  Review the Delivery Report for details.    Follow up:  Follow-up Information    Shea Evans, MD. Schedule an appointment as soon as possible for a visit in 6 week(s).   Specialty: Obstetrics and Gynecology Why: Please make an appointment for 6 weeks postpartum.  Contact information: [address] [place] [address] [phone]               Clancy Gourd, MSN 12/24/2019, 12:35 PM

## 2019-12-24 NOTE — Lactation Note (Signed)
This note was copied from a baby's chart. Lactation Consultation Note  Patient Name: [name] YJEHU'D Date: 12/24/2019 Reason for consult: Follow-up assessment;Primapara;1st time breastfeeding;Term;Infant weight loss;Other (Comment) (3 % weight loss / lots of bottles / see LC note)  LC checked the signed consent for dad to be the [place] interpreter.  Late entry - LC faxed a DEBP referral to [facility] with moms consent.  Baby 83 hours old at the time of the consult.  LC entered the room - and baby wide awake rooting, LC offered to assist to latch,  Wet diaper changed and baby placed STS on the right breast and at 1st on and off latch with increased fussiness. LC fed the baby 20 ml from a bottle to give her  What she had gotten use to and then latched with 24 NS with formula instilled in the top . Baby latched and fed for 20 mins with swallows and being content.  LC stressed the importance of baby having practice at the breast to improve  Breast feeding and recommended until baby content to supplement upto 30 ml or  Offer 2nd breast.  Per mom and dad active with [facility] [place] and do not have a DEBP at home and Consented to send a referral today. LC recommended for dad to call [facility] [place] .  The MBURN reported to the [facility] mom went back to feeding the baby a bottle.  Mom has the [employer].    Maternal Data    Feeding Feeding Type: Formula Nipple Type: Slow - flow  LATCH Score Latch: Grasps breast easily, tongue down, lips flanged, rhythmical sucking. (baby needs practice latching at the breast)  Audible Swallowing: Spontaneous and intermittent  Type of Nipple: Flat  Comfort (Breast/Nipple): Filling, red/small blisters or bruises, mild/mod discomfort  Hold (Positioning): Assistance needed to correctly position infant at breast and maintain latch.  LATCH Score: 7  Interventions Interventions: Breast feeding basics reviewed;Assisted with latch;Skin to  skin;Breast massage;Hand express;Reverse pressure;Breast compression;Adjust position;Support pillows;Position options;Expressed milk;Shells;Hand pump;DEBP  Lactation Tools Discussed/Used Tools: Shells;Pump;Nipple Shields Nipple shield size: 24 Shell Type: Inverted Breast pump type: Manual;Double-Electric Breast Pump WIC Program: Yes (LC send a FAX for mom and she signed form for permission) Pump Review: Milk Storage   Consult Status Consult Status: Complete Date: 12/24/19    Kathrin Greathouse 12/24/2019, 2:15 PM

## 2019-12-24 NOTE — Discharge Instructions (Signed)

## 2020-04-15 ENCOUNTER — Ambulatory Visit: Payer: BC Managed Care – PPO | Admitting: Internal Medicine

## 2020-11-02 IMAGING — CR DG CHEST 2V
2 series · 2 of 2 positions shown · non-contrast
Comparison: No priors.

CLINICAL DATA: 39-year-old female with history of positive
QuantiFERON TB gold.

EXAM:
CHEST - 2 VIEW

[w chest pa]
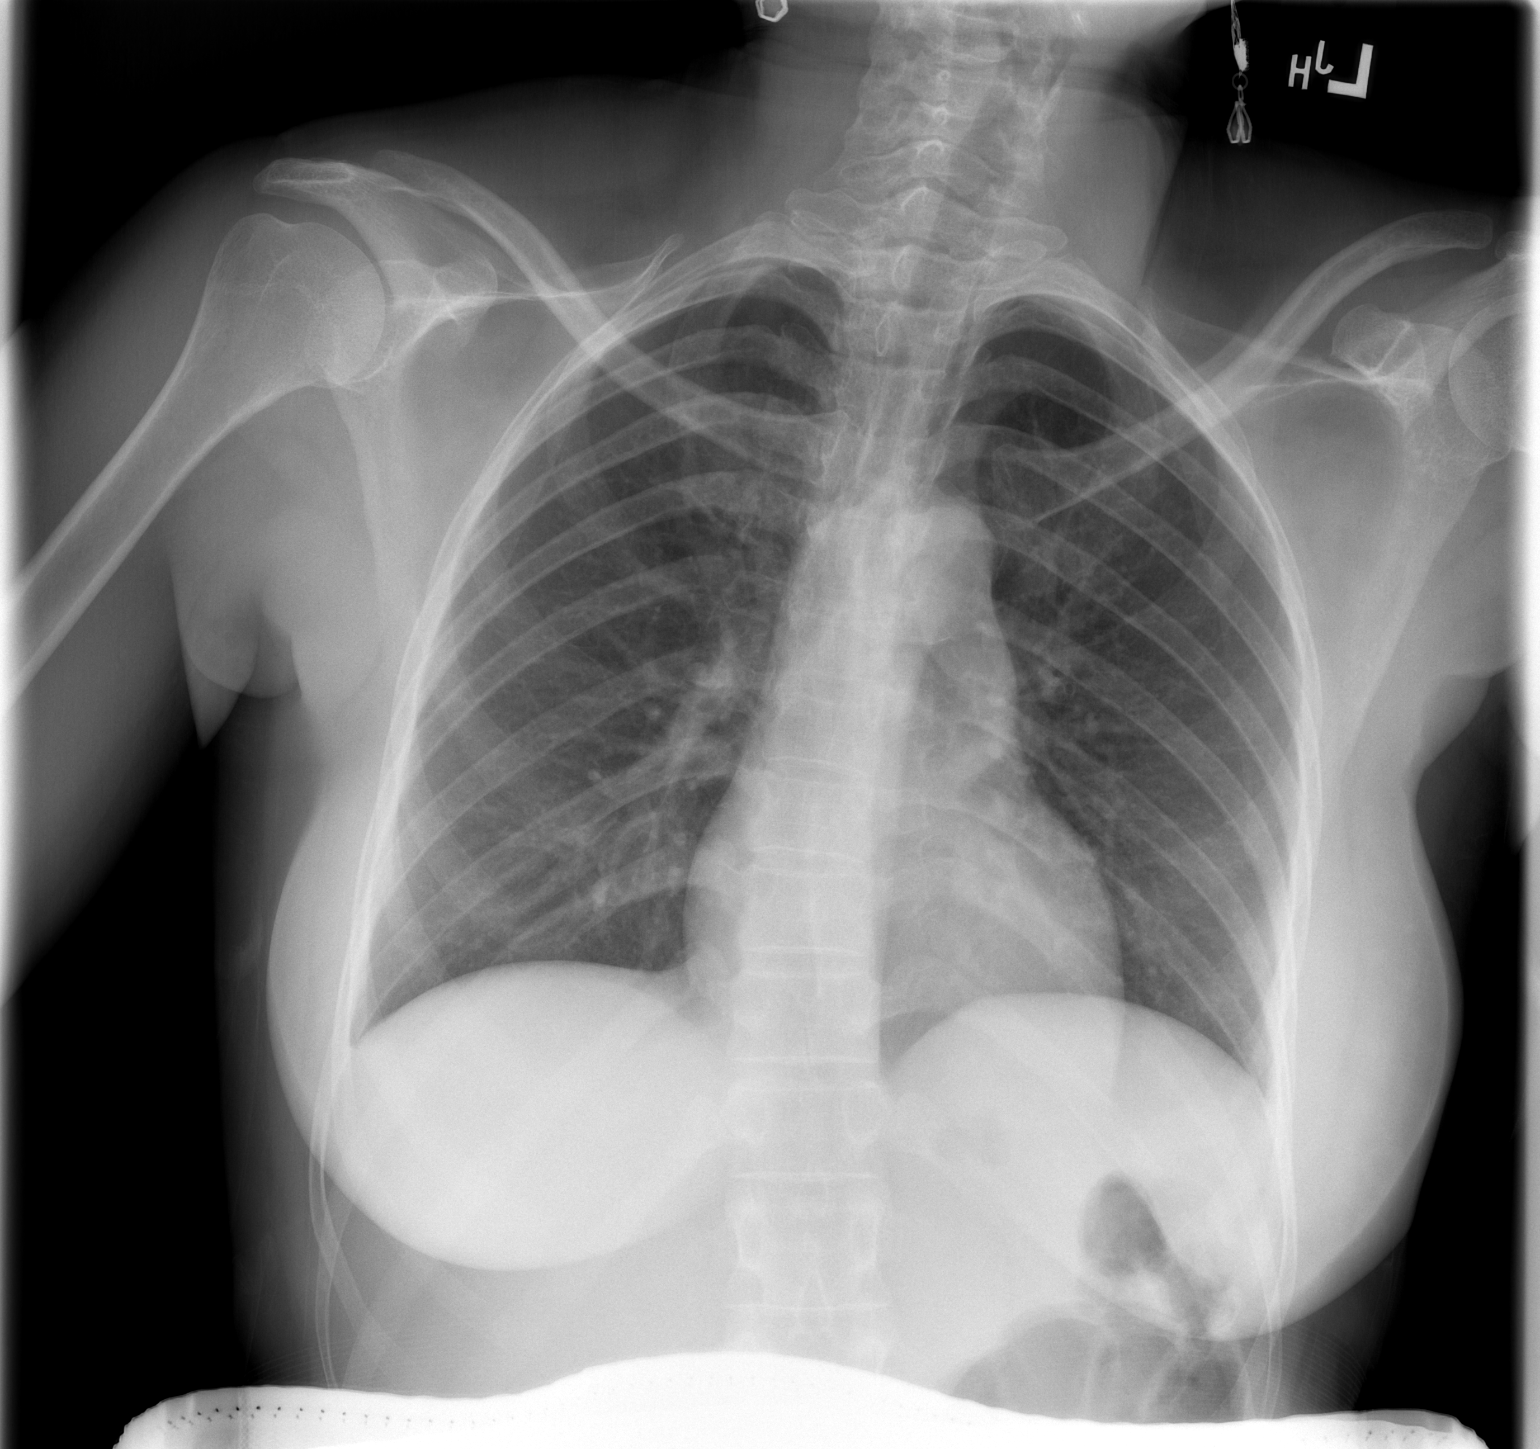

[w chest lat]
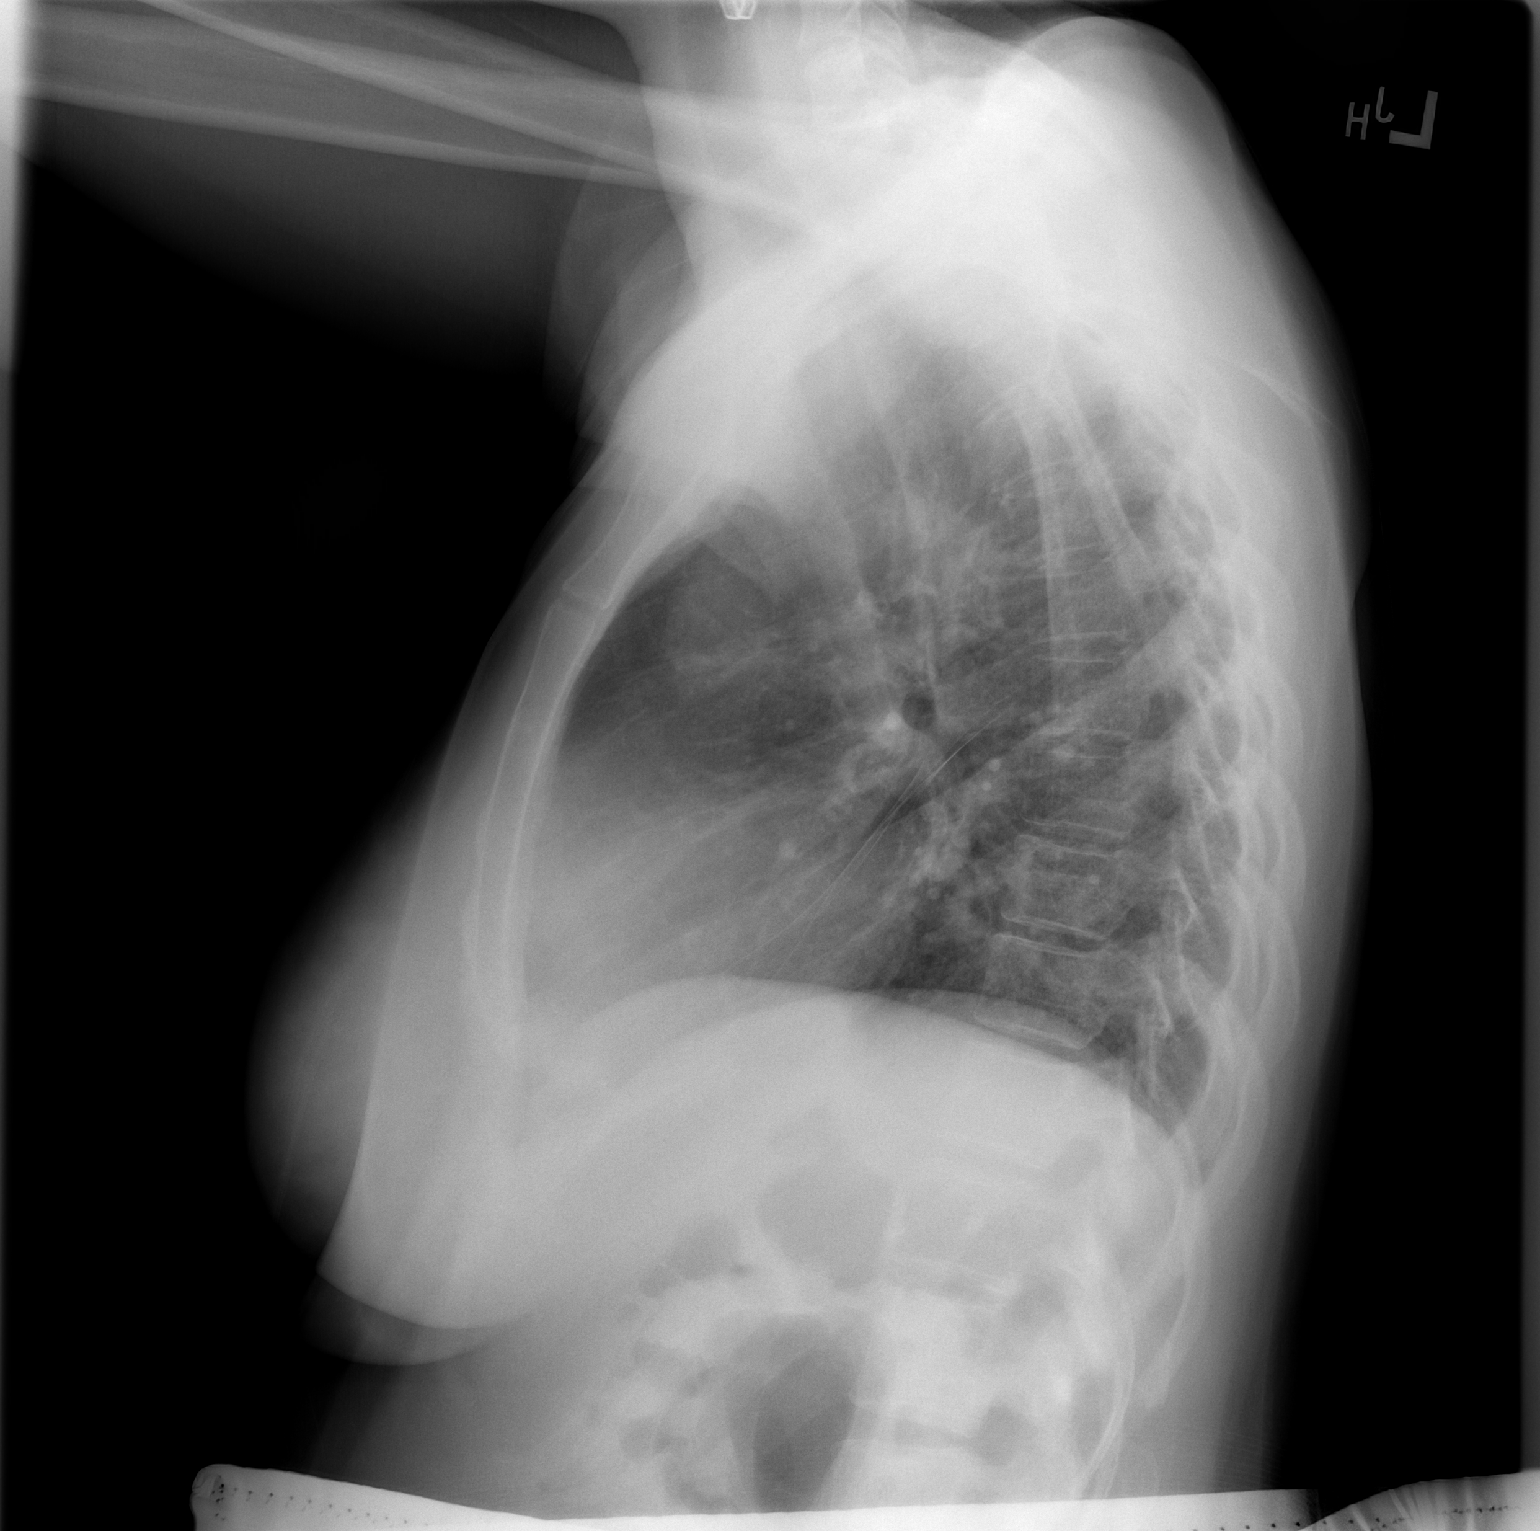

[2 of 2 positions shown; findings below may reference images not displayed]

FINDINGS: Lung volumes are normal. No consolidative airspace disease. No
pleural effusions. No pneumothorax. No pulmonary nodule or mass
noted. Pulmonary vasculature and the cardiomediastinal silhouette
are within normal limits.
IMPRESSION: No radiographic evidence of acute cardiopulmonary disease.

## 2021-02-17 IMAGING — US US MFM FETAL BPP W/O NON-STRESS
1 series · 12 of 28 positions shown · non-contrast
Comparison: none

[Series 1: us mfm fetal bpp w/o non-stress · 35 acquisitions, 12 frames shown]
[im 2/35]
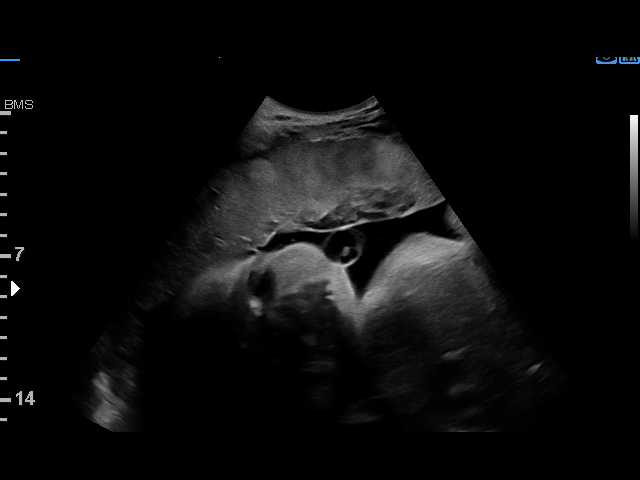
[im 4/35]
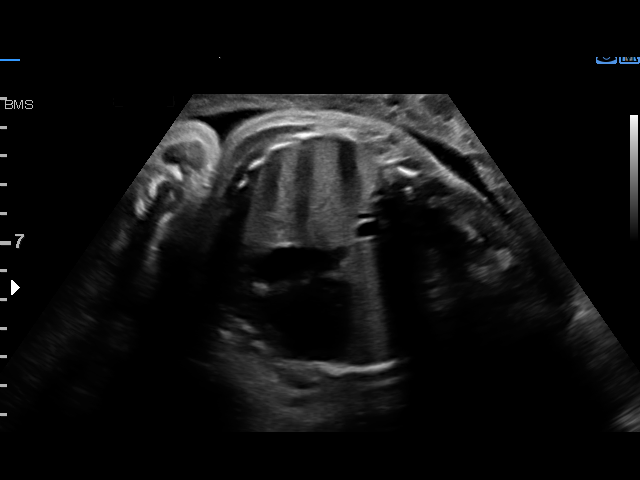
[im 7/35]
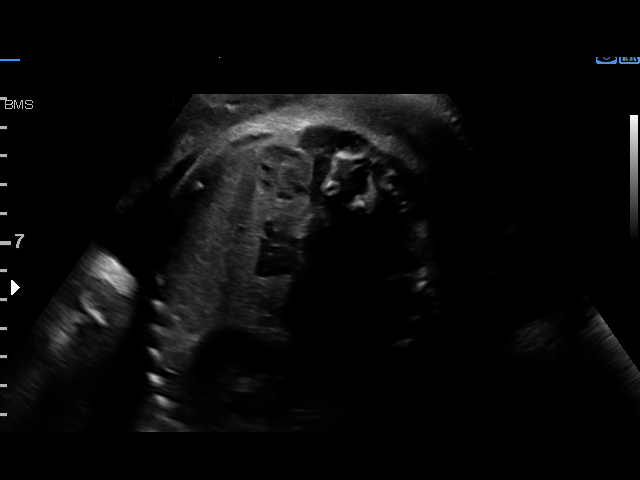
[im 11/35]
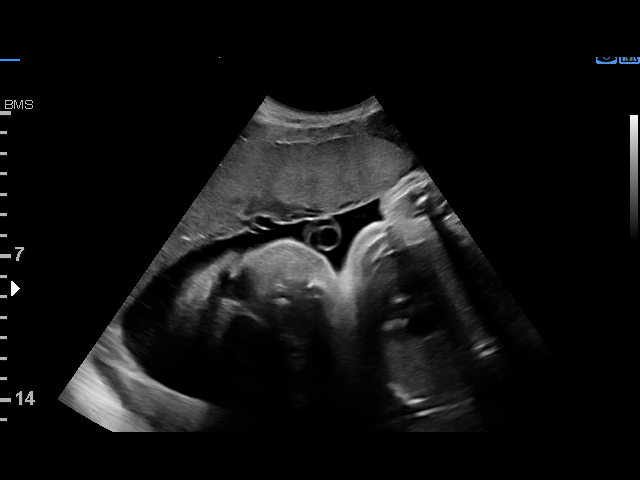
[im 13/35]
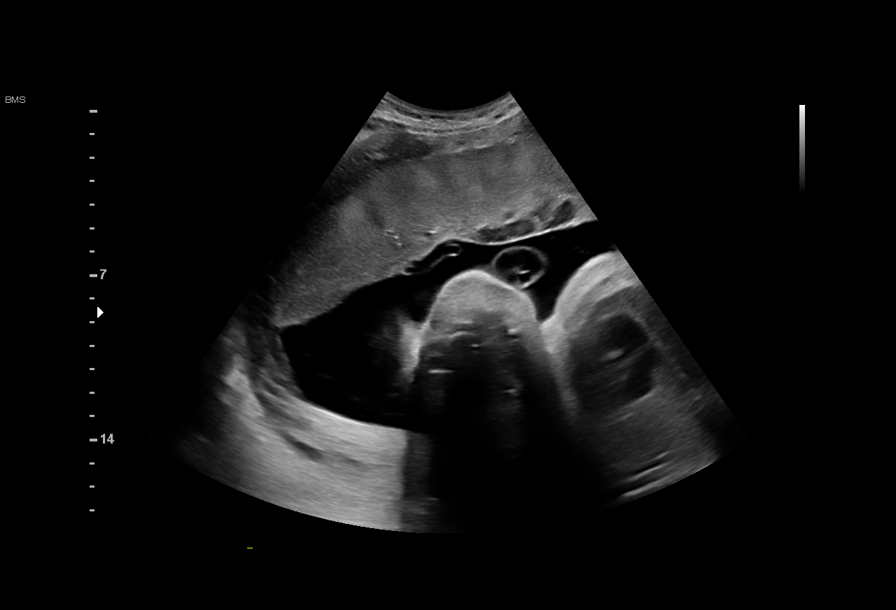
[im 16/35]
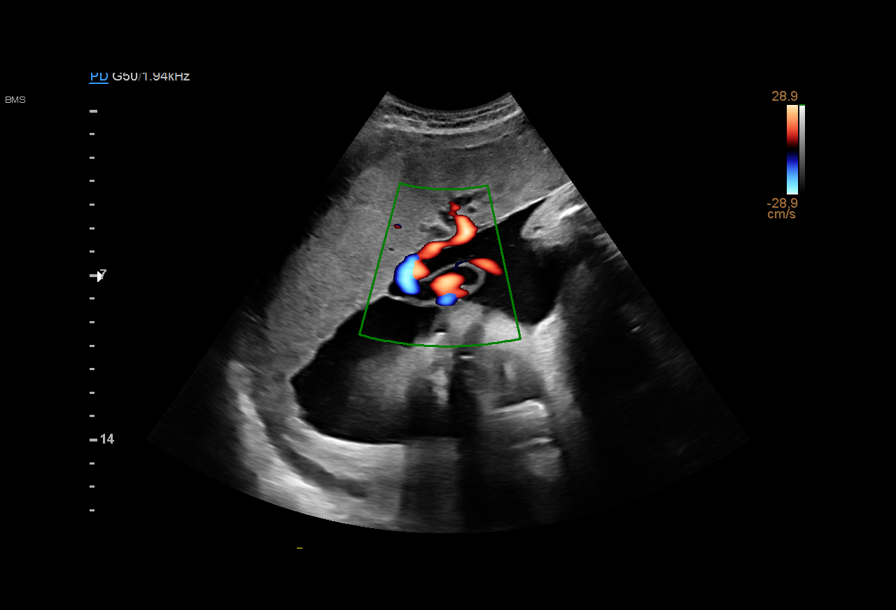
[im 19/35]
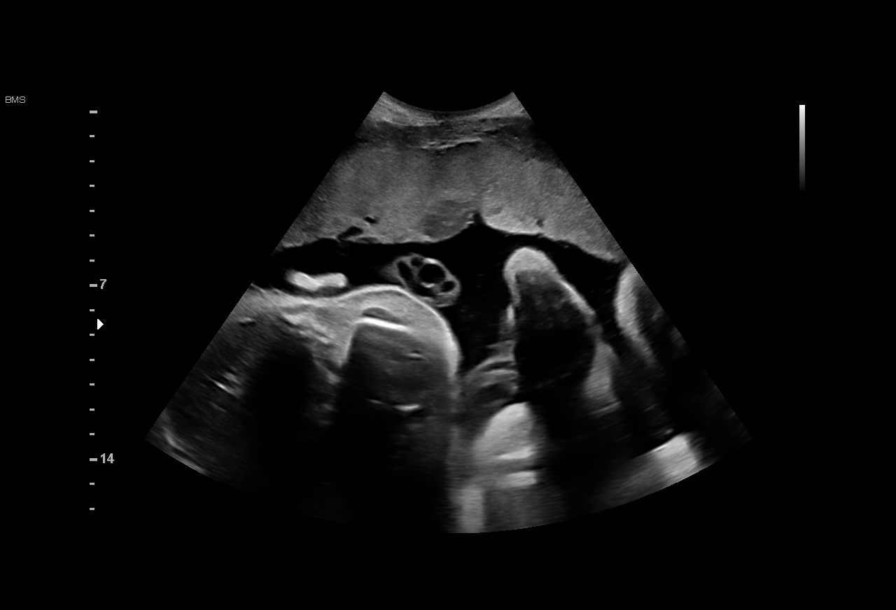
[im 22/35]
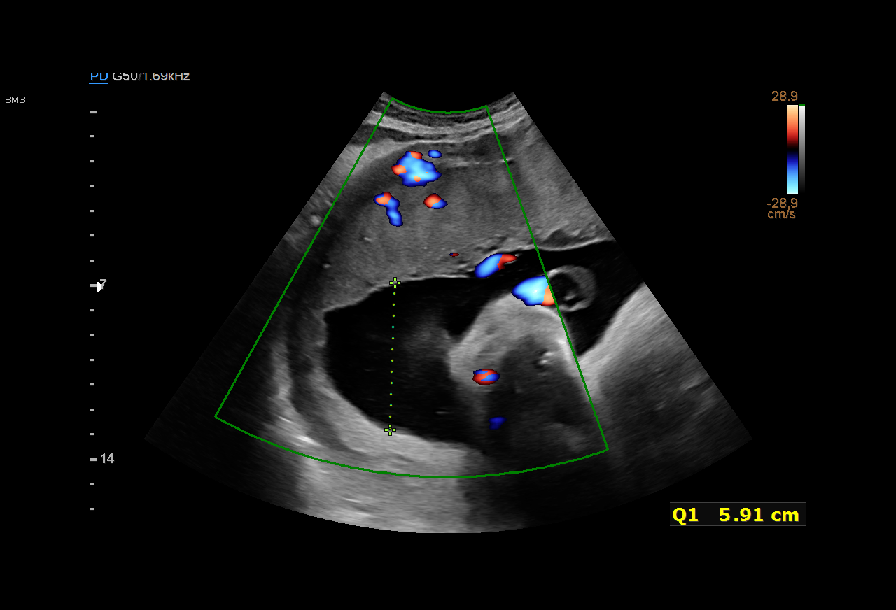
[im 24/35]
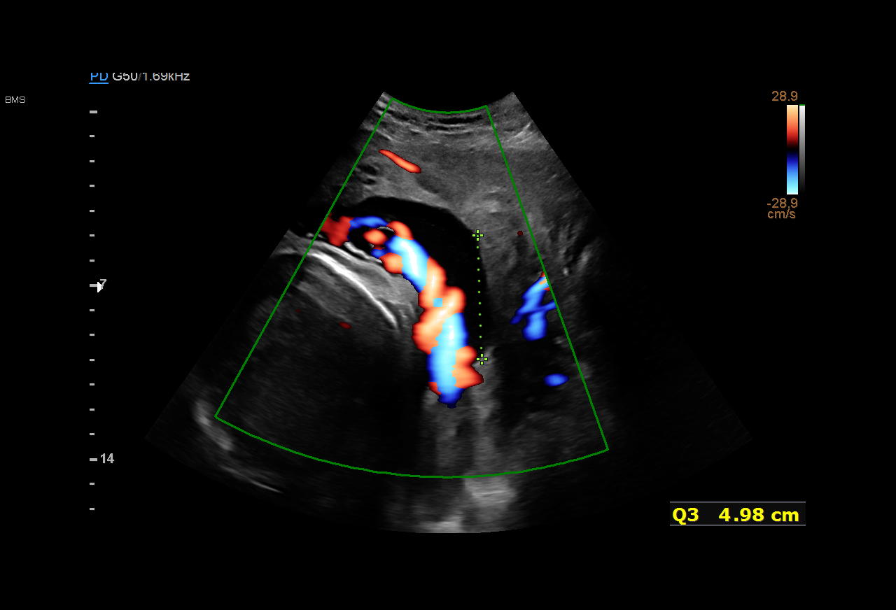
[im 28/35]
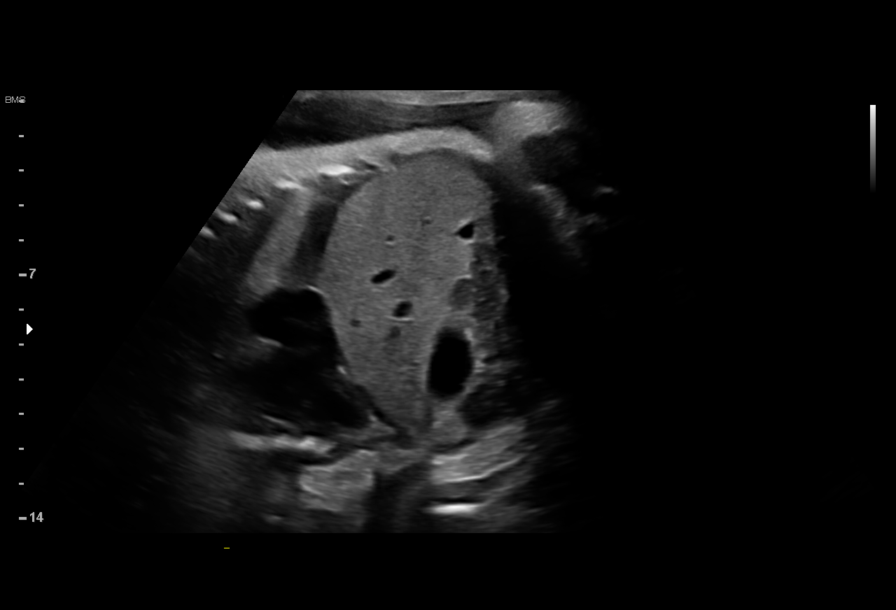
[im 31/35]
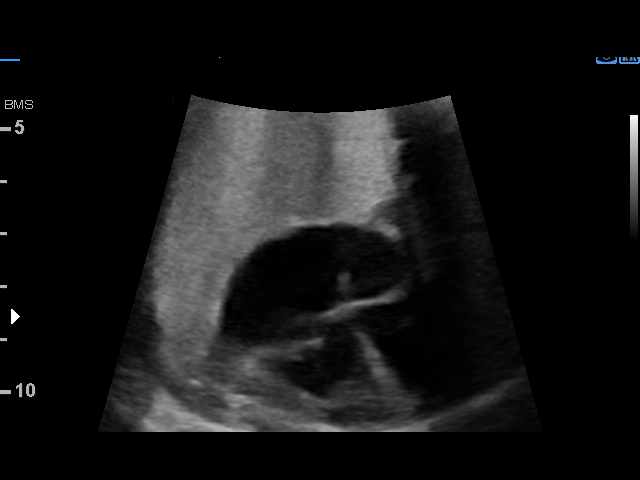
[im 33/35]
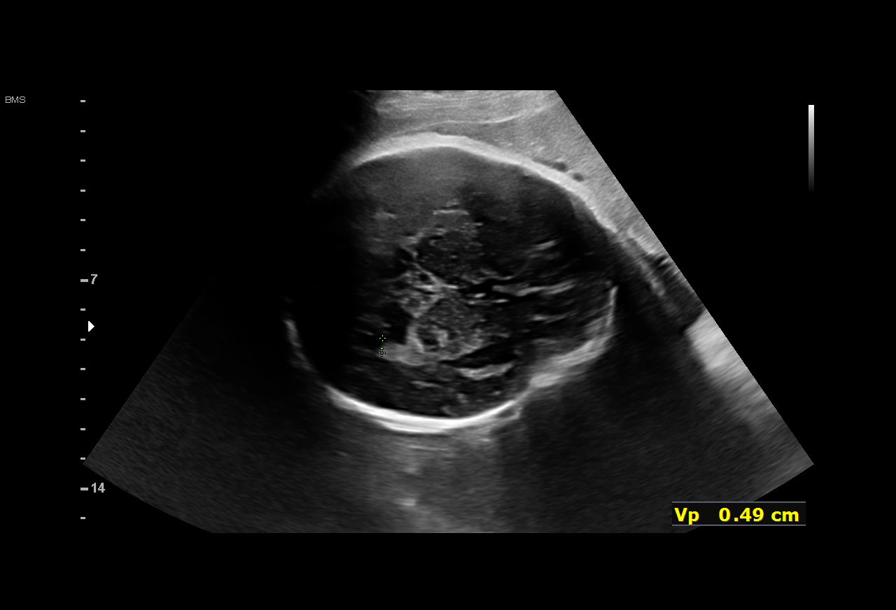

[12 of 28 positions shown; findings below may reference images not displayed]

[REDACTED]

Indications

 36 weeks gestation of pregnancy
 Non-reactive NST
Fetal Evaluation

 Num Of Fetuses:         1
 Fetal Heart Rate(bpm):  158
 Cardiac Activity:       Observed
 Presentation:           Breech
 Placenta:               Anterior
 P. Cord Insertion:      Visualized, central

 Amniotic Fluid
 AFI FV:      Subjectively upper-normal

 AFI Sum(cm)     %Tile       Largest Pocket(cm)
 27.63           97

 RUQ(cm)       RLQ(cm)       LUQ(cm)        LLQ(cm)

Biophysical Evaluation

 Amniotic F.V:   Increased                  F. Tone:        Observed
 F. Movement:    Observed                   Score:          [DATE]
 F. Breathing:   Observed
Biometry
 LV:        4.9  mm
OB History

 Gravidity:    2
 Living:       0
Gestational Age

 Clinical EDD:  36w 4d                                        EDD:   12/23/19
 Best:          36w 4d     Det. By:  Clinical EDD             EDD:   12/23/19
Anatomy

 Ventricles:            Appears normal         Stomach:                Appears normal, left
                                                                       sided
 Diaphragm:             Appears normal         Bladder:                Appears normal
Cervix Uterus Adnexa

 Cervix
 Not visualized (advanced GA >24wks)

 Uterus
 No abnormality visualized.

 Right Ovary
 Not visualized.

 Left Ovary
 Not visualized.

 Cul De Sac
 No free fluid seen.

 Adnexa
 No adnexal mass visualized.
Impression

 Limited exam to assess non-reactive NST.
 Biophysical profile [DATE] with good fetal movement and
 amniotic fluid volume
 Consider daily maternal PEERBUX.
 Polyhydramnios is noted today.
Recommendations

 Follow up growth in 4 weeks
 Consider weekly testing.

## 2022-08-12 LAB — OB RESULTS CONSOLE HIV ANTIBODY (ROUTINE TESTING): HIV: NONREACTIVE

## 2022-08-12 LAB — OB RESULTS CONSOLE RPR: RPR: NONREACTIVE

## 2022-08-12 LAB — OB RESULTS CONSOLE GC/CHLAMYDIA
Chlamydia: NEGATIVE
Neisseria Gonorrhea: NEGATIVE

## 2022-08-12 LAB — OB RESULTS CONSOLE ANTIBODY SCREEN: Antibody Screen: NEGATIVE

## 2022-08-12 LAB — OB RESULTS CONSOLE HEPATITIS B SURFACE ANTIGEN: Hepatitis B Surface Ag: NEGATIVE

## 2022-08-12 LAB — OB RESULTS CONSOLE RUBELLA ANTIBODY, IGM: Rubella: IMMUNE

## 2022-08-12 LAB — HEPATITIS C ANTIBODY: HCV Ab: NEGATIVE

## 2023-02-28 NOTE — Patient Instructions (Signed)
 Virginia Strickland  02/28/2023   Your procedure is scheduled on:  1.13.2025  Arrive at 0800 at Entrance C on [address] at [facility]  and [employer]. You are invited to use the FREE valet parking or use the Visitor's parking deck.  Pick up the phone at the desk and dial [phone].  Call this number if you have problems the morning of surgery: [phone]  Remember:   Do not eat food:(After Midnight) Desps de medianoche.  You may drink clear liquids until arrival at __0800___.  Clear liquids means a liquid you can see thru.  It can have color such as Cola or Kool aid.  Tea is OK and coffee as long as no milk or creamer of any kind.  Take these medicines the morning of surgery with A SIP OF WATER :  none   Do not wear jewelry, make-up or nail polish.  Do not wear lotions, powders, or perfumes. Do not wear deodorant.  Do not shave 48 hours prior to surgery.  Do not bring valuables to the hospital.  [facility] is not   responsible for any belongings or valuables brought to the hospital.  Contacts, dentures or bridgework may not be worn into surgery.  Leave suitcase in the car. After surgery it may be brought to your room.  For patients admitted to the hospital, checkout time is 11:00 AM the day of              discharge.      Please read over the following fact sheets that you were given:     Preparing for Surgery

## 2023-03-03 ENCOUNTER — Encounter (HOSPITAL_COMMUNITY): Payer: Self-pay | Admitting: *Deleted

## 2023-03-04 ENCOUNTER — Encounter (HOSPITAL_COMMUNITY): Payer: Self-pay

## 2023-03-04 ENCOUNTER — Telehealth (HOSPITAL_COMMUNITY): Payer: Self-pay | Admitting: *Deleted

## 2023-03-04 NOTE — Telephone Encounter (Signed)
 Preadmission screen

## 2023-03-07 ENCOUNTER — Telehealth (HOSPITAL_COMMUNITY): Payer: Self-pay | Admitting: *Deleted

## 2023-03-07 NOTE — Telephone Encounter (Signed)
 Preadmission screen

## 2023-03-08 ENCOUNTER — Telehealth (HOSPITAL_COMMUNITY): Payer: Self-pay | Admitting: *Deleted

## 2023-03-08 NOTE — Telephone Encounter (Signed)
 Preadmission screen

## 2023-03-09 ENCOUNTER — Telehealth (HOSPITAL_COMMUNITY): Payer: Self-pay | Admitting: *Deleted

## 2023-03-09 NOTE — Telephone Encounter (Signed)
 Preadmission screen

## 2023-03-10 ENCOUNTER — Encounter (HOSPITAL_COMMUNITY): Payer: Self-pay

## 2023-03-11 ENCOUNTER — Encounter (HOSPITAL_COMMUNITY)
Admission: RE | Admit: 2023-03-11 | Discharge: 2023-03-11 | Disposition: A | Discharge status: Home / Self Care | Payer: BC Managed Care – PPO | Source: Ambulatory Visit | Attending: Obstetrics and Gynecology | Admitting: Obstetrics and Gynecology

## 2023-03-11 DIAGNOSIS — Z01812 Encounter for preprocedural laboratory examination: Secondary | ICD-10-CM | POA: Insufficient documentation

## 2023-03-11 DIAGNOSIS — Z98891 History of uterine scar from previous surgery: Secondary | ICD-10-CM | POA: Insufficient documentation

## 2023-03-11 HISTORY — DX: Other complications of anesthesia, initial encounter: T88.59XA

## 2023-03-11 LAB — CBC
HCT: 36.3 % (ref 36.0–46.0)
Hemoglobin: 11.7 g/dL — ABNORMAL LOW (ref 12.0–15.0)
MCH: 30.2 pg (ref 26.0–34.0)
MCHC: 32.2 g/dL (ref 30.0–36.0)
MCV: 93.6 fL (ref 80.0–100.0)
Platelets: 273 10*3/uL (ref 150–400)
RBC: 3.88 MIL/uL (ref 3.87–5.11)
RDW: 14.6 % (ref 11.5–15.5)
WBC: 5.9 10*3/uL (ref 4.0–10.5)
nRBC: 0 % (ref 0.0–0.2)

## 2023-03-11 LAB — TYPE AND SCREEN
ABO/RH(D): O POS
Antibody Screen: NEGATIVE

## 2023-03-12 LAB — RPR: RPR Ser Ql: NONREACTIVE

## 2023-03-13 ENCOUNTER — Encounter (HOSPITAL_COMMUNITY): Payer: Self-pay | Admitting: Obstetrics and Gynecology

## 2023-03-13 NOTE — H&P (Signed)
 Janissa Karlene Kling is a 43 y.o. female presenting for scheduled procedure. +FM, denies VB, LOF, ctx. PNC c/b 1) H/o csx x1 - failure to progress, desires ERLTCS 2) AMA - NIPT low risk, XX 3) Proteinuria - @ 37 wks. Ur Pr/Cr 0.40. BP wnl GBS neg OB History     Gravida  3   Para  1   Term  1   Preterm      AB  1   Living  1      SAB  1   IAB      Ectopic      Multiple  0   Live Births  1          Past Medical History:  Diagnosis Date   Anemia    Complication of anesthesia    itching   Past Surgical History:  Procedure Laterality Date   CESAREAN SECTION N/A 12/20/2019   Procedure: CESAREAN SECTION;  Surgeon: Barbette Knock, MD;  Location: [facility] LD ORS;  Service: Obstetrics;  Laterality: N/A;   NO PAST SURGERIES     Family History: family history includes Asthma in her mother; Hypertension in her father and mother. Social History:  reports that she has never smoked. She has never used smokeless tobacco. She reports that she does not drink alcohol and does not use drugs.     Maternal Diabetes: No1hr 124 Genetic Screening: Normal Maternal Ultrasounds/Referrals: Normal Fetal Ultrasounds or other Referrals:  None Maternal Substance Abuse:  No Significant Maternal Medications:  None Significant Maternal Lab Results:  Group B Strep negative Number of Prenatal Visits:greater than 3 verified prenatal visits Maternal Vaccinations:TDap Other Comments:  None  Review of Systems  Constitutional:  Negative for chills and fever.  Respiratory:  Negative for shortness of breath.   Cardiovascular:  Negative for chest pain, palpitations and leg swelling.  Gastrointestinal:  Negative for abdominal pain and vomiting.  Neurological:  Negative for dizziness, weakness and headaches.  Psychiatric/Behavioral:  Negative for suicidal ideas.    Maternal Medical History:  Prenatal Complications - Diabetes: none.     unknown if currently breastfeeding. Exam Physical  Exam Constitutional:      General: She is not in acute distress.    Appearance: She is well-developed.  HENT:     Head: Normocephalic and atraumatic.  Eyes:     Pupils: Pupils are equal, round, and reactive to light.  Cardiovascular:     Rate and Rhythm: Normal rate and regular rhythm.     Heart sounds: No murmur heard.    No gallop.  Abdominal:     Tenderness: There is no abdominal tenderness. There is no guarding or rebound.  Genitourinary:    Vagina: Normal.  Musculoskeletal:        General: Normal range of motion.     Cervical back: Normal range of motion and neck supple.  Skin:    General: Skin is warm and dry.  Neurological:     Mental Status: She is alert and oriented to person, place, and time.     Prenatal labs: ABO, Rh: --/--/O POS (01/10 9061) Antibody: NEG (01/10 9061) Rubella: Immune (06/13 0000) RPR: NON REACTIVE (01/10 0930)  HBsAg: Negative (06/13 0000)  HIV: Non-reactive (06/13 0000)  GBS:   NEG  Assessment/Plan: This is a 43yo G3P1011 @ 39 2/7 by LMP c/w 8 5/7 TAUS admitted for scheduled ERLTCS. Overall uncomplicated PNC save AMA and one-time proteinuria in non-hypertensive setting. GBS neg R/B/A of cesarean section discussed with  patient. Alternative would be vaginal delivery which would mean shorter postpartum stay and decreased risk of bleeding. Risks of section include infection of the uterus, pelvic organs, or skin, inadvertent injury to internal organs, such as bowel or bladder. If there is major injury, extensive surgery may be required. If injury is minor, it may be treated with relative ease. Discussed possibility of excessive blood loss and transfusion. If bleeding cannot be controlled using medical or minor surgical methods, a cesarean hysterectomy may be performed which would mean no future fertility. Patient accepts the possibility of blood transfusion, if necessary. Patient understands and agrees to move forward with section. Posterior  placenta   Lavonia CHRISTELLA Guppy 03/13/2023, 9:39 PM

## 2023-03-14 ENCOUNTER — Encounter (HOSPITAL_COMMUNITY)
Admission: AD | Disposition: A | Discharge status: Home / Self Care | Payer: Self-pay | Source: Home / Self Care | Attending: Obstetrics and Gynecology

## 2023-03-14 ENCOUNTER — Other Ambulatory Visit: Payer: Self-pay

## 2023-03-14 ENCOUNTER — Inpatient Hospital Stay (HOSPITAL_COMMUNITY): Payer: BC Managed Care – PPO | Admitting: Anesthesiology

## 2023-03-14 ENCOUNTER — Inpatient Hospital Stay (HOSPITAL_COMMUNITY)
Admission: AD | Admit: 2023-03-14 | Discharge: 2023-03-16 | DRG: 787 | Disposition: A | Discharge status: Home / Self Care | Payer: BC Managed Care – PPO | Attending: Obstetrics and Gynecology | Admitting: Obstetrics and Gynecology

## 2023-03-14 ENCOUNTER — Encounter (HOSPITAL_COMMUNITY): Payer: Self-pay | Admitting: Obstetrics and Gynecology

## 2023-03-14 DIAGNOSIS — O9081 Anemia of the puerperium: Secondary | ICD-10-CM | POA: Diagnosis not present

## 2023-03-14 DIAGNOSIS — Z98891 History of uterine scar from previous surgery: Principal | ICD-10-CM

## 2023-03-14 DIAGNOSIS — O99214 Obesity complicating childbirth: Secondary | ICD-10-CM | POA: Diagnosis present

## 2023-03-14 DIAGNOSIS — L910 Hypertrophic scar: Secondary | ICD-10-CM | POA: Diagnosis present

## 2023-03-14 DIAGNOSIS — O9972 Diseases of the skin and subcutaneous tissue complicating childbirth: Secondary | ICD-10-CM | POA: Diagnosis present

## 2023-03-14 DIAGNOSIS — Z8249 Family history of ischemic heart disease and other diseases of the circulatory system: Secondary | ICD-10-CM | POA: Diagnosis not present

## 2023-03-14 DIAGNOSIS — K219 Gastro-esophageal reflux disease without esophagitis: Secondary | ICD-10-CM | POA: Diagnosis present

## 2023-03-14 DIAGNOSIS — D62 Acute posthemorrhagic anemia: Secondary | ICD-10-CM | POA: Diagnosis not present

## 2023-03-14 DIAGNOSIS — O99892 Other specified diseases and conditions complicating childbirth: Principal | ICD-10-CM

## 2023-03-14 DIAGNOSIS — O9962 Diseases of the digestive system complicating childbirth: Secondary | ICD-10-CM | POA: Diagnosis present

## 2023-03-14 DIAGNOSIS — Z3A39 39 weeks gestation of pregnancy: Secondary | ICD-10-CM

## 2023-03-14 DIAGNOSIS — O34211 Maternal care for low transverse scar from previous cesarean delivery: Principal | ICD-10-CM | POA: Diagnosis present

## 2023-03-14 LAB — CBC WITH DIFFERENTIAL/PLATELET
Abs Immature Granulocytes: 0.05 10*3/uL (ref 0.00–0.07)
Basophils Absolute: 0 10*3/uL (ref 0.0–0.1)
Basophils Relative: 0 %
Eosinophils Absolute: 0 10*3/uL (ref 0.0–0.5)
Eosinophils Relative: 0 %
HCT: 28.8 % — ABNORMAL LOW (ref 36.0–46.0)
Hemoglobin: 9.5 g/dL — ABNORMAL LOW (ref 12.0–15.0)
Immature Granulocytes: 1 %
Lymphocytes Relative: 8 %
Lymphs Abs: 0.8 10*3/uL (ref 0.7–4.0)
MCH: 30.1 pg (ref 26.0–34.0)
MCHC: 33 g/dL (ref 30.0–36.0)
MCV: 91.1 fL (ref 80.0–100.0)
Monocytes Absolute: 0.4 10*3/uL (ref 0.1–1.0)
Monocytes Relative: 3 %
Neutro Abs: 9.6 10*3/uL — ABNORMAL HIGH (ref 1.7–7.7)
Neutrophils Relative %: 88 %
Platelets: 230 10*3/uL (ref 150–400)
RBC: 3.16 MIL/uL — ABNORMAL LOW (ref 3.87–5.11)
RDW: 14.5 % (ref 11.5–15.5)
WBC: 10.8 10*3/uL — ABNORMAL HIGH (ref 4.0–10.5)
nRBC: 0 % (ref 0.0–0.2)

## 2023-03-14 LAB — CBC
HCT: 35.7 % — ABNORMAL LOW (ref 36.0–46.0)
Hemoglobin: 11.4 g/dL — ABNORMAL LOW (ref 12.0–15.0)
MCH: 29.8 pg (ref 26.0–34.0)
MCHC: 31.9 g/dL (ref 30.0–36.0)
MCV: 93.2 fL (ref 80.0–100.0)
Platelets: 201 10*3/uL (ref 150–400)
RBC: 3.83 MIL/uL — ABNORMAL LOW (ref 3.87–5.11)
RDW: 14.6 % (ref 11.5–15.5)
WBC: 4.9 10*3/uL (ref 4.0–10.5)
nRBC: 0 % (ref 0.0–0.2)

## 2023-03-14 LAB — DIC (DISSEMINATED INTRAVASCULAR COAGULATION)PANEL
D-Dimer, Quant: 4.21 ug{FEU}/mL — ABNORMAL HIGH (ref 0.00–0.50)
Fibrinogen: 484 mg/dL — ABNORMAL HIGH (ref 210–475)
INR: 1.1 (ref 0.8–1.2)
Platelets: 189 10*3/uL (ref 150–400)
Prothrombin Time: 14.8 s (ref 11.4–15.2)
Smear Review: NONE SEEN
aPTT: 29 s (ref 24–36)

## 2023-03-14 SURGERY — Surgical Case
Anesthesia: Spinal

## 2023-03-14 MED ORDER — BUPIVACAINE IN DEXTROSE 0.75-8.25 % IT SOLN
INTRATHECAL | Status: DC | PRN
  Administered 2023-03-14: 1.6 mL via INTRATHECAL

## 2023-03-14 MED ORDER — FENTANYL CITRATE (PF) 100 MCG/2ML IJ SOLN: 25.0000 ug | INTRAMUSCULAR | Status: DC | PRN

## 2023-03-14 MED ORDER — LACTATED RINGERS IV SOLN: INTRAVENOUS | Status: DC

## 2023-03-14 MED ORDER — DIPHENHYDRAMINE HCL 25 MG PO CAPS: 25.0000 mg | ORAL_CAPSULE | Freq: Four times a day (QID) | ORAL | Status: DC | PRN

## 2023-03-14 MED ORDER — PHENYLEPHRINE HCL (PRESSORS) 10 MG/ML IV SOLN
INTRAVENOUS | Status: DC | PRN
  Administered 2023-03-14: 80 ug via INTRAVENOUS

## 2023-03-14 MED ORDER — DEXMEDETOMIDINE HCL IN NACL 80 MCG/20ML IV SOLN
INTRAVENOUS | Status: AC
  Filled 2023-03-14: qty 20

## 2023-03-14 MED ORDER — ONDANSETRON HCL 4 MG/2ML IJ SOLN
INTRAMUSCULAR | Status: DC | PRN
  Administered 2023-03-14: 4 mg via INTRAVENOUS

## 2023-03-14 MED ORDER — DIBUCAINE (PERIANAL) 1 % EX OINT: 1.0000 | TOPICAL_OINTMENT | CUTANEOUS | Status: DC | PRN

## 2023-03-14 MED ORDER — PHENYLEPHRINE HCL-NACL 20-0.9 MG/250ML-% IV SOLN
INTRAVENOUS | Status: AC
  Filled 2023-03-14: qty 250

## 2023-03-14 MED ORDER — ONDANSETRON HCL 4 MG/2ML IJ SOLN
INTRAMUSCULAR | Status: AC
  Filled 2023-03-14: qty 2

## 2023-03-14 MED ORDER — ONDANSETRON HCL 4 MG/2ML IJ SOLN: 4.0000 mg | Freq: Three times a day (TID) | INTRAMUSCULAR | Status: DC | PRN

## 2023-03-14 MED ORDER — SIMETHICONE 80 MG PO CHEW: 80.0000 mg | CHEWABLE_TABLET | ORAL | Status: DC | PRN

## 2023-03-14 MED ORDER — METHYLERGONOVINE MALEATE 0.2 MG/ML IJ SOLN
INTRAMUSCULAR | Status: AC
  Filled 2023-03-14: qty 1

## 2023-03-14 MED ORDER — FENTANYL CITRATE (PF) 100 MCG/2ML IJ SOLN
INTRAMUSCULAR | Status: AC
  Filled 2023-03-14: qty 2

## 2023-03-14 MED ORDER — ACETAMINOPHEN 10 MG/ML IV SOLN
INTRAVENOUS | Status: DC | PRN
  Administered 2023-03-14: 1000 mg via INTRAVENOUS

## 2023-03-14 MED ORDER — OXYTOCIN-SODIUM CHLORIDE 30-0.9 UT/500ML-% IV SOLN
INTRAVENOUS | Status: AC
  Filled 2023-03-14: qty 500

## 2023-03-14 MED ORDER — WITCH HAZEL-GLYCERIN EX PADS: 1.0000 | MEDICATED_PAD | CUTANEOUS | Status: DC | PRN

## 2023-03-14 MED ORDER — MORPHINE SULFATE (PF) 0.5 MG/ML IJ SOLN
INTRAMUSCULAR | Status: AC
  Filled 2023-03-14: qty 10

## 2023-03-14 MED ORDER — ZOLPIDEM TARTRATE 5 MG PO TABS: 5.0000 mg | ORAL_TABLET | Freq: Every evening | ORAL | Status: DC | PRN

## 2023-03-14 MED ORDER — PRENATAL MULTIVITAMIN CH
1.0000 | ORAL_TABLET | Freq: Every day | ORAL | Status: DC
  Administered 2023-03-15 – 2023-03-16 (×2): 1 via ORAL
  Filled 2023-03-14 (×2): qty 1

## 2023-03-14 MED ORDER — PHENYLEPHRINE HCL-NACL 20-0.9 MG/250ML-% IV SOLN
INTRAVENOUS | Status: DC | PRN
  Administered 2023-03-14: 60 ug/min via INTRAVENOUS

## 2023-03-14 MED ORDER — CHLORHEXIDINE GLUCONATE 0.12 % MT SOLN
15.0000 mL | Freq: Once | OROMUCOSAL | Status: AC
  Administered 2023-03-14: 15 mL via OROMUCOSAL

## 2023-03-14 MED ORDER — CHLORHEXIDINE GLUCONATE 0.12 % MT SOLN
OROMUCOSAL | Status: AC
  Filled 2023-03-14: qty 15

## 2023-03-14 MED ORDER — DIPHENHYDRAMINE HCL 50 MG/ML IJ SOLN
12.5000 mg | INTRAMUSCULAR | Status: DC | PRN
Start: 2023-03-14 — End: 2023-03-15
  Administered 2023-03-14 – 2023-03-15 (×2): 12.5 mg via INTRAVENOUS
  Filled 2023-03-14 (×2): qty 1

## 2023-03-14 MED ORDER — ONDANSETRON HCL 4 MG/2ML IJ SOLN: 4.0000 mg | Freq: Once | INTRAMUSCULAR | Status: DC | PRN

## 2023-03-14 MED ORDER — DEXMEDETOMIDINE HCL IN NACL 80 MCG/20ML IV SOLN
INTRAVENOUS | Status: DC | PRN
  Administered 2023-03-14 (×4): 8 ug via INTRAVENOUS

## 2023-03-14 MED ORDER — SODIUM CHLORIDE 0.9% FLUSH: 3.0000 mL | INTRAVENOUS | Status: DC | PRN

## 2023-03-14 MED ORDER — DEXAMETHASONE SODIUM PHOSPHATE 10 MG/ML IJ SOLN
INTRAMUSCULAR | Status: DC | PRN
  Administered 2023-03-14: 10 mg via INTRAVENOUS

## 2023-03-14 MED ORDER — POVIDONE-IODINE 10 % EX SWAB
2.0000 | Freq: Once | CUTANEOUS | Status: AC
  Administered 2023-03-14: 2 via TOPICAL

## 2023-03-14 MED ORDER — CEFAZOLIN SODIUM-DEXTROSE 2-4 GM/100ML-% IV SOLN
2.0000 g | INTRAVENOUS | Status: AC
  Administered 2023-03-14 (×2): 2 g via INTRAVENOUS

## 2023-03-14 MED ORDER — LACTATED RINGERS IV BOLUS: 500.0000 mL | Freq: Once | INTRAVENOUS | Status: DC

## 2023-03-14 MED ORDER — ACETAMINOPHEN 500 MG PO TABS
1000.0000 mg | ORAL_TABLET | Freq: Four times a day (QID) | ORAL | Status: DC
  Administered 2023-03-14 – 2023-03-16 (×8): 1000 mg via ORAL
  Filled 2023-03-14 (×8): qty 2

## 2023-03-14 MED ORDER — MORPHINE SULFATE (PF) 0.5 MG/ML IJ SOLN
INTRAMUSCULAR | Status: DC | PRN
  Administered 2023-03-14: 150 ug via INTRATHECAL

## 2023-03-14 MED ORDER — SODIUM CHLORIDE (PF) 0.9 % IJ SOLN
INTRAMUSCULAR | Status: AC
  Filled 2023-03-14: qty 20

## 2023-03-14 MED ORDER — SCOPOLAMINE 1 MG/3DAYS TD PT72: 1.0000 | MEDICATED_PATCH | Freq: Once | TRANSDERMAL | Status: DC

## 2023-03-14 MED ORDER — ALBUMIN HUMAN 5 % IV SOLN: INTRAVENOUS | Status: DC | PRN

## 2023-03-14 MED ORDER — FAMOTIDINE 20 MG PO TABS
20.0000 mg | ORAL_TABLET | Freq: Once | ORAL | Status: AC
  Administered 2023-03-14: 20 mg via ORAL

## 2023-03-14 MED ORDER — KETOROLAC TROMETHAMINE 30 MG/ML IJ SOLN
30.0000 mg | Freq: Four times a day (QID) | INTRAMUSCULAR | Status: AC | PRN
Start: 2023-03-14 — End: 2023-03-15

## 2023-03-14 MED ORDER — FENTANYL CITRATE (PF) 100 MCG/2ML IJ SOLN
INTRAMUSCULAR | Status: DC | PRN
  Administered 2023-03-14: 15 ug via INTRATHECAL

## 2023-03-14 MED ORDER — SCOPOLAMINE 1 MG/3DAYS TD PT72
1.0000 | MEDICATED_PATCH | Freq: Once | TRANSDERMAL | Status: DC
  Administered 2023-03-14: 1.5 mg via TRANSDERMAL

## 2023-03-14 MED ORDER — FAMOTIDINE 20 MG PO TABS
ORAL_TABLET | ORAL | Status: AC
  Filled 2023-03-14: qty 1

## 2023-03-14 MED ORDER — SOD CITRATE-CITRIC ACID 500-334 MG/5ML PO SOLN
ORAL | Status: AC
  Filled 2023-03-14: qty 30

## 2023-03-14 MED ORDER — OXYCODONE HCL 5 MG PO TABS
5.0000 mg | ORAL_TABLET | ORAL | Status: DC | PRN
Start: 2023-03-14 — End: 2023-03-16
  Administered 2023-03-14 – 2023-03-15 (×3): 10 mg via ORAL
  Administered 2023-03-16: 5 mg via ORAL
  Filled 2023-03-14 (×4): qty 2

## 2023-03-14 MED ORDER — SIMETHICONE 80 MG PO CHEW
80.0000 mg | CHEWABLE_TABLET | Freq: Three times a day (TID) | ORAL | Status: DC
  Administered 2023-03-14 – 2023-03-16 (×6): 80 mg via ORAL
  Filled 2023-03-14 (×6): qty 1

## 2023-03-14 MED ORDER — SENNOSIDES-DOCUSATE SODIUM 8.6-50 MG PO TABS
2.0000 | ORAL_TABLET | Freq: Every day | ORAL | Status: DC
  Administered 2023-03-15: 2 via ORAL
  Filled 2023-03-14 (×2): qty 2

## 2023-03-14 MED ORDER — ALBUMIN HUMAN 5 % IV SOLN
INTRAVENOUS | Status: AC
  Filled 2023-03-14: qty 250

## 2023-03-14 MED ORDER — KETOROLAC TROMETHAMINE 30 MG/ML IJ SOLN: 30.0000 mg | Freq: Four times a day (QID) | INTRAMUSCULAR | Status: AC | PRN

## 2023-03-14 MED ORDER — AMISULPRIDE (ANTIEMETIC) 5 MG/2ML IV SOLN: 10.0000 mg | Freq: Once | INTRAVENOUS | Status: DC | PRN

## 2023-03-14 MED ORDER — ACETAMINOPHEN 10 MG/ML IV SOLN
INTRAVENOUS | Status: AC
  Filled 2023-03-14: qty 100

## 2023-03-14 MED ORDER — STERILE WATER FOR IRRIGATION IR SOLN
Status: DC | PRN
  Administered 2023-03-14: 1

## 2023-03-14 MED ORDER — METHYLERGONOVINE MALEATE 0.2 MG/ML IJ SOLN
INTRAMUSCULAR | Status: DC | PRN
  Administered 2023-03-14: .2 mg via INTRAMUSCULAR

## 2023-03-14 MED ORDER — OXYTOCIN-SODIUM CHLORIDE 30-0.9 UT/500ML-% IV SOLN
2.5000 [IU]/h | INTRAVENOUS | Status: AC
  Administered 2023-03-14: 2.5 [IU]/h via INTRAVENOUS
  Filled 2023-03-14: qty 500

## 2023-03-14 MED ORDER — SOD CITRATE-CITRIC ACID 500-334 MG/5ML PO SOLN
30.0000 mL | Freq: Once | ORAL | Status: AC
  Administered 2023-03-14: 30 mL via ORAL

## 2023-03-14 MED ORDER — NALOXONE HCL 0.4 MG/ML IJ SOLN: 0.4000 mg | INTRAMUSCULAR | Status: DC | PRN

## 2023-03-14 MED ORDER — CEFAZOLIN SODIUM-DEXTROSE 2-4 GM/100ML-% IV SOLN
INTRAVENOUS | Status: AC
  Filled 2023-03-14: qty 100

## 2023-03-14 MED ORDER — ACETAMINOPHEN 500 MG PO TABS: 1000.0000 mg | ORAL_TABLET | Freq: Four times a day (QID) | ORAL | Status: DC

## 2023-03-14 MED ORDER — KETOROLAC TROMETHAMINE 30 MG/ML IJ SOLN
30.0000 mg | Freq: Four times a day (QID) | INTRAMUSCULAR | Status: AC
  Administered 2023-03-14 – 2023-03-15 (×3): 30 mg via INTRAVENOUS
  Filled 2023-03-14 (×3): qty 1

## 2023-03-14 MED ORDER — COCONUT OIL OIL: 1.0000 | TOPICAL_OIL | Status: DC | PRN

## 2023-03-14 MED ORDER — DEXAMETHASONE SODIUM PHOSPHATE 10 MG/ML IJ SOLN
INTRAMUSCULAR | Status: AC
  Filled 2023-03-14: qty 1

## 2023-03-14 MED ORDER — NALOXONE HCL 4 MG/10ML IJ SOLN: 1.0000 ug/kg/h | INTRAVENOUS | Status: DC | PRN

## 2023-03-14 MED ORDER — MENTHOL 3 MG MT LOZG: 1.0000 | LOZENGE | OROMUCOSAL | Status: DC | PRN

## 2023-03-14 MED ORDER — IBUPROFEN 600 MG PO TABS
600.0000 mg | ORAL_TABLET | Freq: Four times a day (QID) | ORAL | Status: DC
  Administered 2023-03-15 – 2023-03-16 (×5): 600 mg via ORAL
  Filled 2023-03-14 (×5): qty 1

## 2023-03-14 MED ORDER — TRIAMCINOLONE ACETONIDE 40 MG/ML IJ SUSP
INTRAMUSCULAR | Status: AC
  Filled 2023-03-14: qty 1

## 2023-03-14 MED ORDER — OXYTOCIN-SODIUM CHLORIDE 30-0.9 UT/500ML-% IV SOLN
INTRAVENOUS | Status: DC | PRN
  Administered 2023-03-14: 300 mL via INTRAVENOUS

## 2023-03-14 MED ORDER — SODIUM CHLORIDE 0.9 % IR SOLN
Status: DC | PRN
  Administered 2023-03-14: 1

## 2023-03-14 MED ORDER — MEPERIDINE HCL 25 MG/ML IJ SOLN: 6.2500 mg | INTRAMUSCULAR | Status: DC | PRN

## 2023-03-14 MED ORDER — ORAL CARE MOUTH RINSE: 15.0000 mL | Freq: Once | OROMUCOSAL | Status: AC

## 2023-03-14 MED ORDER — TRANEXAMIC ACID-NACL 1000-0.7 MG/100ML-% IV SOLN
INTRAVENOUS | Status: DC | PRN
  Administered 2023-03-14: 1000 mg via INTRAVENOUS

## 2023-03-14 MED ORDER — DIPHENHYDRAMINE HCL 25 MG PO CAPS: 25.0000 mg | ORAL_CAPSULE | ORAL | Status: DC | PRN

## 2023-03-14 MED ORDER — SCOPOLAMINE 1 MG/3DAYS TD PT72
MEDICATED_PATCH | TRANSDERMAL | Status: AC
  Filled 2023-03-14: qty 1

## 2023-03-14 SURGICAL SUPPLY — 35 items
BENZOIN TINCTURE PRP APPL 2/3 (GAUZE/BANDAGES/DRESSINGS) IMPLANT
CHLORAPREP W/TINT 26 (MISCELLANEOUS) ×2 IMPLANT
CLAMP UMBILICAL CORD (MISCELLANEOUS) ×1 IMPLANT
CLOTH BEACON ORANGE TIMEOUT ST (SAFETY) ×1 IMPLANT
DERMABOND ADVANCED .7 DNX12 (GAUZE/BANDAGES/DRESSINGS) ×1 IMPLANT
DRSG OPSITE POSTOP 4X10 (GAUZE/BANDAGES/DRESSINGS) ×1 IMPLANT
ELECT REM PT RETURN 9FT ADLT (ELECTROSURGICAL) ×1
ELECTRODE REM PT RTRN 9FT ADLT (ELECTROSURGICAL) ×1 IMPLANT
EXTRACTOR VACUUM BELL STYLE (SUCTIONS) IMPLANT
GAUZE SPONGE 4X4 12PLY STRL LF (GAUZE/BANDAGES/DRESSINGS) IMPLANT
GLOVE BIO SURGEON STRL SZ 6.5 (GLOVE) ×1 IMPLANT
GLOVE BIOGEL PI IND STRL 6.5 (GLOVE) ×1 IMPLANT
GLOVE BIOGEL PI IND STRL 7.0 (GLOVE) ×2 IMPLANT
GOWN STRL REUS W/TWL LRG LVL3 (GOWN DISPOSABLE) ×2 IMPLANT
KIT ABG SYR 3ML LUER SLIP (SYRINGE) IMPLANT
NDL HYPO 25X5/8 SAFETYGLIDE (NEEDLE) IMPLANT
NDL SAFETY ECLIPSE 18X1.5 (NEEDLE) IMPLANT
NEEDLE HYPO 25X5/8 SAFETYGLIDE (NEEDLE) IMPLANT
NS IRRIG 1000ML POUR BTL (IV SOLUTION) ×1 IMPLANT
PACK C SECTION WH (CUSTOM PROCEDURE TRAY) ×1 IMPLANT
PAD ABD 8X10 STRL (GAUZE/BANDAGES/DRESSINGS) IMPLANT
PAD OB MATERNITY 4.3X12.25 (PERSONAL CARE ITEMS) ×1 IMPLANT
RTRCTR C-SECT PINK 25CM LRG (MISCELLANEOUS) IMPLANT
SPONGE LAP 18X18 X RAY DECT (DISPOSABLE) IMPLANT
SUT PDS AB 0 CTX 36 PDP370T (SUTURE) IMPLANT
SUT PLAIN ABS 2-0 CT1 27XMFL (SUTURE) IMPLANT
SUT VIC AB 0 CT1 36 (SUTURE) ×2 IMPLANT
SUT VIC AB 2-0 CT1 TAPERPNT 27 (SUTURE) ×1 IMPLANT
SUT VIC AB 3-0 SH 27X BRD (SUTURE) IMPLANT
SUT VIC AB 4-0 KS 27 (SUTURE) ×1 IMPLANT
SYR 10ML LL (SYRINGE) IMPLANT
SYR CONTROL 10ML LL (SYRINGE) IMPLANT
TOWEL OR 17X24 6PK STRL BLUE (TOWEL DISPOSABLE) ×1 IMPLANT
TRAY FOLEY W/BAG SLVR 14FR LF (SET/KITS/TRAYS/PACK) ×1 IMPLANT
WATER STERILE IRR 1000ML POUR (IV SOLUTION) ×1 IMPLANT

## 2023-03-14 NOTE — Lactation Note (Signed)
 This note was copied from a baby's chart. Lactation Consultation Note  Patient Name: Virginia Strickland Date: 03/14/2023 Age:44 hours Reason for consult: Initial assessment;Term  P2- MOB's feeding plan for infant is both breast and formula. MOB states that she did this with her first child for 18 months as well due to not creating enough milk. MOB states that infant just fed for 5 minutes on the breast and then she took infant off to offer formula. LC asked MOB why she took infant off and didn't allow infant to continue nursing. MOB replied saying that she does not have milk, so she doesn't want infant to get tired from the breast and not take the formula. LC reviewed the first 24 hr birthday nap. LC also reviewed the difference between colostrum and mature milk. LC offered to demonstrate hand expression to see colostrum, but MOB declined. LC encouraged MOB to allow infant to nurse for as long as she wants and only offer formula after as needed.  LC reviewed feeding infant on cue 8-12x in 24 hrs, not allowing infant to go over 3 hrs without a feeding, CDC milks storage guidelines and LC services handout. LC encouraged MOB to call for a feeding so LC could assess a latch.  Maternal Data Has patient been taught Hand Expression?: Yes Does the patient have breastfeeding experience prior to this delivery?: Yes How long did the patient breastfeed?: 18 months of both breast and formula for first child  Feeding Mother's Current Feeding Choice: Breast Milk and Formula Nipple Type: Slow - flow  Lactation Tools Discussed/Used Pump Education: Milk Storage  Interventions Interventions: Breast feeding basics reviewed;Education;LC Services brochure  Discharge Discharge Education: Warning signs for feeding baby WIC Program: Yes  Consult Status Consult Status: Follow-up Date: 03/15/23 Follow-up type: In-patient    Recardo Hoit BS, IBCLC 03/14/2023, 5:47 PM

## 2023-03-14 NOTE — Op Note (Signed)
 C-Section Operative Note  Date: 03/14/23  Preoperative Diagnosis: IUP @ 39 2/7 Postoperative Diagnosis: same as above, PPH Procedure: 1) repeat low transverse cesarean section with extension at right hysterotomy apex 2) Keloid excision Indications: h/o csx x1, declining TOLAC Surgeon: Lavonia Guppy, MD Assist: Jolene Gaskins, MD Findings: Viable female infant weighing 8lb4oz with APGARS of 9 and 9 at 1 and 5 minutes, respectively. Normal appearing uterus, bilateral fallopian tubes and ovaries. Some fascial scarring but otherwise no intra-abdominal adhesions. Significant vascularity encountered at right apex of hystereotomy which was noted after infant delivery. Specimens: placenta  EBL 2137 IVF 2300 UOP 100  Patient Course: Admitted for scheduled ERLTCS, Desires keloid excision with kenalog  inject after  Consent:  R/B/A of cesarean section discussed with patient. Alternative would be vaginal delivery which would mean shorter postpartum stay and decreased risk of bleeding. Risks of section include infection of the uterus, pelvic organs, or skin, inadvertent injury to internal organs, such as bowel or bladder. If there is major injury, extensive surgery may be required. If injury is minor, it may be treated with relative ease. Discussed possibility of excessive blood loss and transfusion. If bleeding cannot be controlled using medical or minor surgical methods, a cesarean hysterectomy may be performed which would mean no future fertility. Patient accepts the possibility of blood transfusion, if necessary. Patient understands and agrees to move forward with section.   Operative Procedure: Patient was taken to the operating room where epidural anesthesia was found to be adequate by Allis clamp test. She was prepped and draped in the normal sterile fashion in the dorsal supine position with a leftward tilt. An appropriate time out was performed. A Pfannenstiel skin incision was then made with the  scalpel and carried through to the underlying layer of fascia by sharp dissection and Bovie cautery. The fascia was nicked in the midline and the incision was extended laterally with Mayo scissors. The superior aspect of the incision was grasped using Kocher clamps and dissected off the underlying rectus muscles. In a similar fashion the inferior aspect was dissected off the rectus muscles. Rectus muscles were separated in the midline and the peritoneal cavity entered bluntly. The peritoneal incision was then extended both superiorly and inferiorly with careful attention to avoid both bowel and bladder. The Alexis self-retaining wound retractor was then placed within the incision and the lower uterine segment exposed. The bladder flap was developed with Metzenbaum scissors and pushed away from the lower uterine segment. The lower uterine segment was then incised in a low transverse fashion and the cavity itself entered bluntly. The incision was extended bluntly. Amniotic sac was ruptured and fluid was noted to be clear in color. The infant's head was then lifted and delivered from the incision without difficulty using the standard movements. The remainder of the infant delivered and the nose and mouth bulb suctioned with the cord clamped and cut as well. Assist at this time noted arterial bleed at right apex and clamps using ring forceps.The infant was handed off to NICU. The placenta was then spontaneously expressed from the uterus and the uterus cleared of all clots and debris with moist lap sponge. The uterine incision was then repaired in 2 layers the first layer was a running locked layer of 0-vicryl and the second an imbricating layer of the same suture. The tubes and ovaries were inspected and the gutters cleared of all clots and debris. The uterine incision was inspected and found to be hemostatic. Intermittent atony noted with EBL  at 2L, TXA and IM methergine  administered while pressure held to hysterotomy.  Uterine tone responded well, excellent hemostasis. All instruments and sponges as well as the Alexis retractor were then removed from the abdomen. The fascia was then closed with 0 Vicryl in a running fashion. Subcutaneous tissue was reapproximated with 3-0 plain in a running fashion. Kenalog  inject diluted in 10cc NS administered subcutaneously.The skin was closed with a subcuticular stitch of 4-0 Vicryl on a Keith needle and then reinforced with Dermabond and a honeycomb. At the conclusion of the procedure all instruments and sponge counts were correct. Patient was taken to the recovery room in good condition with her baby accompanying her skin to skin.   Given EBL, plan for CBC at 1800 tonight. Dr Gretta on-call next 24hrs and assisted with case, aware of plan

## 2023-03-14 NOTE — Progress Notes (Signed)
 PT declined baby scripts app.  Paper book given to pt.

## 2023-03-14 NOTE — Anesthesia Procedure Notes (Signed)
 Spinal  Patient location during procedure: OR Start time: 03/14/2023 10:11 AM End time: 03/14/2023 10:16 AM Reason for block: surgical anesthesia Staffing Performed: anesthesiologist  Anesthesiologist: Corinne Garnette BRAVO, MD Performed by: Corinne Garnette BRAVO, MD Authorized by: Corinne Garnette BRAVO, MD   Preanesthetic Checklist Completed: patient identified, IV checked, risks and benefits discussed, surgical consent, monitors and equipment checked, pre-op evaluation and timeout performed Spinal Block Patient position: sitting Prep: DuraPrep and site prepped and draped Patient monitoring: continuous pulse ox and blood pressure Approach: midline Location: L3-4 Injection technique: single-shot Needle Needle type: Pencan  Needle gauge: 24 G Assessment Events: CSF return Additional Notes Functioning IV was confirmed and monitors were applied. Sterile prep and drape, including hand hygiene, mask and sterile gloves were used. The patient was positioned and the spine was prepped. The skin was anesthetized with lidocaine .  Free flow of clear CSF was obtained prior to injecting local anesthetic into the CSF.  The spinal needle aspirated freely following injection.  The needle was carefully withdrawn.  The patient tolerated the procedure well. Consent was obtained prior to procedure with all questions answered and concerns addressed. Risks including but not limited to bleeding, infection, nerve damage, paralysis, failed block, inadequate analgesia, allergic reaction, high spinal, itching and headache were discussed and the patient wished to proceed.   Spinal placed by SRNA under MD direction.  Garnette Corinne, MD

## 2023-03-14 NOTE — Anesthesia Postprocedure Evaluation (Signed)
 Anesthesia Post Note  Patient: Virginia Strickland  Procedure(s) Performed: CESAREAN SECTION     Patient location during evaluation: PACU Anesthesia Type: Spinal Level of consciousness: awake, awake and alert and oriented Pain management: pain level controlled Vital Signs Assessment: post-procedure vital signs reviewed and stable Respiratory status: spontaneous breathing, nonlabored ventilation and respiratory function stable Cardiovascular status: blood pressure returned to baseline and stable Postop Assessment: no headache, no backache, spinal receding and no apparent nausea or vomiting Anesthetic complications: no   No notable events documented.  Last Vitals:  Vitals:   03/14/23 1450 03/14/23 1550  BP: 112/60 104/67  Pulse: 80 81  Resp: 16 16  Temp: 36.8 C 36.6 C  SpO2: 99% 99%    Last Pain:  Vitals:   03/14/23 1550  TempSrc: Oral  PainSc: 2    Pain Goal: Patients Stated Pain Goal: 5 (03/14/23 9178)              Epidural/Spinal Function Cutaneous sensation: Tingles (03/14/23 1550), Patient able to flex knees: Yes (03/14/23 1550), Patient able to lift hips off bed: No (03/14/23 1550), Back pain beyond tenderness at insertion site: No (03/14/23 1550), Progressively worsening motor and/or sensory loss: No (03/14/23 1550), Bowel and/or bladder incontinence post epidural: No (03/14/23 1550)  Garnette FORBES Skillern

## 2023-03-14 NOTE — Anesthesia Preprocedure Evaluation (Addendum)
 Anesthesia Evaluation  Patient identified by MRN, date of birth, ID band Patient awake    Reviewed: Allergy & Precautions, NPO status , Patient's Chart, lab work & pertinent test results  Airway Mallampati: II  TM Distance: >3 FB Neck ROM: Full    Dental  (+) Teeth Intact, Dental Advisory Given   Pulmonary neg pulmonary ROS   Pulmonary exam normal breath sounds clear to auscultation       Cardiovascular negative cardio ROS Normal cardiovascular exam Rhythm:Regular Rate:Normal     Neuro/Psych negative neurological ROS  negative psych ROS   GI/Hepatic Neg liver ROS,GERD  Medicated,,  Endo/Other  Obesity   Renal/GU negative Renal ROS     Musculoskeletal negative musculoskeletal ROS (+)    Abdominal   Peds  Hematology  (+) Blood dyscrasia, anemia Plt 273k   Anesthesia Other Findings Day of surgery medications reviewed with the patient.  Reproductive/Obstetrics (+) Pregnancy                             Anesthesia Physical Anesthesia Plan  ASA: 2  Anesthesia Plan: Spinal   Post-op Pain Management:    Induction: Intravenous  PONV Risk Score and Plan: 2 and Treatment may vary due to age or medical condition, Scopolamine  patch - Pre-op, Dexamethasone  and Ondansetron   Airway Management Planned: Natural Airway  Additional Equipment:   Intra-op Plan:   Post-operative Plan:   Informed Consent: I have reviewed the patients History and Physical, chart, labs and discussed the procedure including the risks, benefits and alternatives for the proposed anesthesia with the patient or authorized representative who has indicated his/her understanding and acceptance.     Dental advisory given  Plan Discussed with: CRNA, Anesthesiologist and Surgeon  Anesthesia Plan Comments: (Discussed risks and benefits of and differences between spinal and general. Discussed risks of spinal including  headache, backache, failure, bleeding, infection, and nerve damage. Patient consents to spinal. Questions answered. Coagulation studies and platelet count acceptable.)       Anesthesia Quick Evaluation

## 2023-03-14 NOTE — Transfer of Care (Signed)
 Immediate Anesthesia Transfer of Care Note  Patient: Virginia Strickland  Procedure(s) Performed: CESAREAN SECTION  Patient Location: PACU  Anesthesia Type:Spinal  Level of Consciousness: awake, alert , and oriented  Airway & Oxygen Therapy: Patient Spontanous Breathing  Post-op Assessment: Report given to RN and Post -op Vital signs reviewed and stable  Post vital signs: Reviewed and stable  Last Vitals:  Vitals Value Taken Time  BP 97/63 03/14/23 1135  Temp    Pulse 86 03/14/23 1138  Resp 16   SpO2 98 % 03/14/23 1138  Vitals shown include unfiled device data.  Last Pain:  Vitals:   03/14/23 0821  TempSrc: Oral  PainSc: 0-No pain      Patients Stated Pain Goal: 5 (03/14/23 9178)  Complications: No notable events documented.

## 2023-03-14 NOTE — Interval H&P Note (Signed)
 History and Physical Interval Note:  03/14/2023 10:04 AM  Virginia Strickland  has presented today for surgery, with the diagnosis of previous cesarean section.  The various methods of treatment have been discussed with the patient and family. After consideration of risks, benefits and other options for treatment, the patient has consented to  Procedure(s): CESAREAN SECTION (N/A) as a surgical intervention.  The patient's history has been reviewed, patient examined, no change in status, stable for surgery.  I have reviewed the patient's chart and labs.  Questions were answered to the patient's satisfaction.   First BP with diastolic 90, repeat WNL, denies PreE symptoms. Trend BP postpartum  Gicela Schwarting M Efrat Zuidema

## 2023-03-15 LAB — CBC
HCT: 23.9 % — ABNORMAL LOW (ref 36.0–46.0)
Hemoglobin: 7.8 g/dL — ABNORMAL LOW (ref 12.0–15.0)
MCH: 30.4 pg (ref 26.0–34.0)
MCHC: 32.6 g/dL (ref 30.0–36.0)
MCV: 93 fL (ref 80.0–100.0)
Platelets: 206 10*3/uL (ref 150–400)
RBC: 2.57 MIL/uL — ABNORMAL LOW (ref 3.87–5.11)
RDW: 14.6 % (ref 11.5–15.5)
WBC: 11.1 10*3/uL — ABNORMAL HIGH (ref 4.0–10.5)
nRBC: 0 % (ref 0.0–0.2)

## 2023-03-15 MED ORDER — HYDROXYZINE HCL 25 MG PO TABS
25.0000 mg | ORAL_TABLET | Freq: Four times a day (QID) | ORAL | Status: DC | PRN
  Administered 2023-03-15 (×2): 25 mg via ORAL
  Filled 2023-03-15 (×2): qty 1

## 2023-03-15 MED ORDER — HYDROXYZINE HCL 25 MG PO TABS
25.0000 mg | ORAL_TABLET | Freq: Once | ORAL | Status: AC
  Administered 2023-03-15: 25 mg via ORAL
  Filled 2023-03-15: qty 1

## 2023-03-15 MED ORDER — FERROUS SULFATE 325 (65 FE) MG PO TABS
325.0000 mg | ORAL_TABLET | Freq: Every day | ORAL | Status: DC
  Administered 2023-03-15 – 2023-03-16 (×2): 325 mg via ORAL
  Filled 2023-03-15 (×2): qty 1

## 2023-03-15 NOTE — Progress Notes (Signed)
 Subjective: Postpartum Day 1: Cesarean Delivery Patient reports tolerating PO, pain well controlled, lochia mild. She has had persistent itching since surgery - moderately relieved with vistaril . She had her foley catheter removed this am - has not voided yet. Admits has not eaten yet this am. Mild flatus. She denies dizziness or lightheadedness or palpitations. Bonding well with baby - wants to bottle/breastfeed    Objective: Vital signs in last 24 hours: Temp:  [97.5 F (36.4 C)-98.2 F (36.8 C)] 98.2 F (36.8 C) (01/14 0436) Pulse Rate:  [75-91] 77 (01/14 0436) Resp:  [14-22] 19 (01/14 0436) BP: (96-114)/(55-82) 105/55 (01/14 0436) SpO2:  [97 %-100 %] 100 % (01/14 0436)  Physical Exam:  General: alert, cooperative, and mild distress - due to itching; no rash Lochia: appropriate Uterine Fundus: firm Incision: no significant drainage DVT Evaluation: No evidence of DVT seen on physical exam. Scds still in place  Recent Labs    03/14/23 1813 03/15/23 0610  HGB 9.5* 7.8*  HCT 28.8* 23.9*    Assessment/Plan: Status post Cesarean section. Doing well postoperatively.  Continue current care - vistaril  routinely, rec aveeno or cetaphil lotion Monitor for symptomatic anemia - currently stable . Iron  supps daily .  Ted LELON Solo, DO 03/15/2023, 9:00 AM

## 2023-03-16 MED ORDER — IBUPROFEN 600 MG PO TABS: 600.0000 mg | ORAL_TABLET | Freq: Four times a day (QID) | ORAL | 1 refills | Status: AC | PRN

## 2023-03-16 MED ORDER — FERROUS SULFATE 325 (65 FE) MG PO TABS: 325.0000 mg | ORAL_TABLET | Freq: Every day | ORAL | 3 refills | Status: AC

## 2023-03-16 MED ORDER — ACETAMINOPHEN 500 MG PO TABS: 1000.0000 mg | ORAL_TABLET | Freq: Four times a day (QID) | ORAL | 3 refills | Status: AC | PRN

## 2023-03-16 MED ORDER — OXYCODONE HCL 5 MG PO TABS: 5.0000 mg | ORAL_TABLET | ORAL | 0 refills | Status: AC | PRN

## 2023-03-16 NOTE — Progress Notes (Signed)
 Subjective: Postpartum Day 2: Cesarean Delivery Patient is doing well this morning. Pain is controlled. Ambulating, voiding, tolerating PO. Minimal lochia. Breastfeeding. No lightheadedness or dizziness. Itching resolved.   Objective: Patient Vitals for the past 24 hrs:  BP Temp Temp src Pulse Resp SpO2  03/16/23 0519 103/69 97.9 F (36.6 C) Oral 77 16 --  03/15/23 2008 113/70 98.6 F (37 C) Oral 86 18 100 %  03/15/23 1500 102/66 98.3 F (36.8 C) Oral 81 18 100 %   Physical Exam:  General: alert, cooperative, and no distress Lochia: appropriate Uterine Fundus: firm Incision: healing well, no significant drainage, no dehiscence, no significant erythema. Honeycomb dressing.  DVT Evaluation: No evidence of DVT seen on physical exam.  Recent Labs    03/14/23 1813 03/15/23 0610  HGB 9.5* 7.8*  HCT 28.8* 23.9*    Assessment/Plan: Virginia Strickland G3P2012 POD#2 sp RCS at  [redacted]w[redacted]d 1. PPC: routine PP care 2. Acute blood loss anemia, clinically significant: asymptomatic, continue PO iron  3. Rh pos 4. Dispo: meeting milestones, stable for discharge home. Instructions reviewed.   Theodoro Fisherman 03/16/2023, 9:49 AM

## 2023-03-16 NOTE — Discharge Summary (Signed)
 Postpartum Discharge Summary  Date of Service updated 03/16/23      Patient Name: Virginia Strickland DOB: 19-Dec-1980 MRN: [id number]  Date of admission: 03/14/2023 Delivery date:03/14/2023 Delivering provider: Gaspar Karma Date of discharge: 03/16/2023  Admitting diagnosis: Indication for care in labor or delivery [O75.9] Intrauterine pregnancy: [redacted]w[redacted]d     Secondary diagnosis:  Principal Problem:   Indication for care in labor or delivery  Additional problems: none    Discharge diagnosis: Term Pregnancy Delivered, Anemia, and PPH                                              Post partum procedures: none Augmentation: N/A Complications: Hemorrhage>1051mL  Hospital course: Sceduled C/S   43 y.o. yo [id number] at [redacted]w[redacted]d was admitted to the hospital 03/14/2023 for scheduled cesarean section with the following indication:Elective Repeat.Delivery details are as follows:  Membrane Rupture Time/Date: 10:42 AM,03/14/2023  Delivery Method:C-Section, Low Transverse Operative Delivery:N/A Details of operation can be found in separate operative note.  Patient had a postpartum course complicated by anemia, asymptomatic.  She is ambulating, tolerating a regular diet, passing flatus, and urinating well. Patient is discharged home in stable condition on  03/16/23        Newborn Data: Birth date:03/14/2023 Birth time:10:42 AM Gender:Female Living status:Living Apgars:9 ,9  Weight:3750 g    Magnesium  Sulfate received: No BMZ received: No Rhophylac:N/A MMR:N/A T-DaP:Given prenatally Flu: No RSV Vaccine received: No Transfusion:No Immunizations administered: Immunization History  Administered Date(s) Administered   Influenza,inj,Quad PF,6+ Mos 12/24/2019    Physical exam  Vitals:   03/15/23 0436 03/15/23 1500 03/15/23 2008 03/16/23 0519  BP: (!) 105/55 102/66 113/70 103/69  Pulse: 77 81 86 77  Resp: 19 18 18 16   Temp: 98.2 F (36.8 C) 98.3 F (36.8 C) 98.6 F (37 C) 97.9 F  (36.6 C)  TempSrc: Oral Oral Oral Oral  SpO2: 100% 100% 100%   Weight:      Height:       General: alert, cooperative, and no distress Lochia: appropriate Uterine Fundus: firm Incision: Healing well with no significant drainage DVT Evaluation: No evidence of DVT seen on physical exam. Labs: Lab Results  Component Value Date   WBC 11.1 (H) 03/15/2023   HGB 7.8 (L) 03/15/2023   HCT 23.9 (L) 03/15/2023   MCV 93.0 03/15/2023   PLT 206 03/15/2023       No data to display         Edinburgh Score:    03/14/2023    8:15 PM  Edinburgh Postnatal Depression Scale Screening Tool  I have been able to laugh and see the funny side of things. 0  I have looked forward with enjoyment to things. 0  I have blamed myself unnecessarily when things went wrong. 1  I have been anxious or worried for no good reason. 1  I have felt scared or panicky for no good reason. 1  Things have been getting on top of me. 1  I have been so unhappy that I have had difficulty sleeping. 1  I have felt sad or miserable. 0  I have been so unhappy that I have been crying. 0  The thought of harming myself has occurred to me. 0  Edinburgh Postnatal Depression Scale Total 5      After visit meds:  Allergies as of  03/16/2023   No Known Allergies      Medication List     TAKE these medications    acetaminophen  500 MG tablet Commonly known as: TYLENOL  Take 2 tablets (1,000 mg total) by mouth every 6 (six) hours as needed for mild pain (pain score 1-3).   famotidine  20 MG tablet Commonly known as: PEPCID  Take 20 mg by mouth daily.   ferrous sulfate  325 (65 FE) MG tablet Take 1 tablet (325 mg total) by mouth daily with breakfast. Start taking on: March 17, 2023   ibuprofen  600 MG tablet Commonly known as: ADVIL  Take 1 tablet (600 mg total) by mouth every 6 (six) hours as needed for moderate pain (pain score 4-6) or cramping.   oxyCODONE  5 MG immediate release tablet Commonly known as: Oxy  IR/ROXICODONE  Take 1 tablet (5 mg total) by mouth every 4 (four) hours as needed for severe pain (pain score 7-10).   pantoprazole 40 MG tablet Commonly known as: PROTONIX Take 40 mg by mouth daily as needed (Heartburn).   prenatal multivitamin Tabs tablet Take 1 tablet by mouth daily.         Discharge home in stable condition Infant Feeding: Breast Infant Disposition:home with mother Discharge instruction: per After Visit Summary and Postpartum booklet. Activity: Advance as tolerated. Pelvic rest for 6 weeks.  Diet: iron  rich diet Anticipated Birth Control: Unsure Postpartum Appointment:6 weeks Additional Postpartum F/U: Incision check 1 week Future Appointments:No future appointments. Follow up Visit:  Follow-up Information     [facility]. Schedule an appointment as soon as possible for a visit in 1 week(s).   Why: Incision check Contact information: [address]  [address] [place] [address] [phone]                     03/16/2023 Theodoro Fisherman, MD

## 2023-03-16 NOTE — Lactation Note (Signed)
 This note was copied from a baby's chart. Lactation Consultation Note  Patient Name: Virginia Strickland [id number] Date: 03/16/2023 Age:44 hours Reason for consult: Follow-up assessment;Infant weight loss (LC reviewed the doc flow sheets - mostly bottles upto 40 ml)   Maternal Data    Feeding Mother's Current Feeding Choice: Breast Milk and Formula Nipple Type: Slow - flow     Consult Status Consult Status: Complete Date: 03/16/23    Richarda Chance 03/16/2023, 11:41 AM

## 2023-03-26 ENCOUNTER — Telehealth (HOSPITAL_COMMUNITY): Payer: Self-pay

## 2023-03-26 NOTE — Telephone Encounter (Signed)
03/26/2023 1711  Name: Virginia Strickland MRN: [id number] DOB: 07/09/1980  Reason for Call:  Transition of Care Hospital Discharge Call  Contact Status: Patient Contact Status: Message  Language assistant needed: Interpreter Mode: Telephonic Interpreter Interpreter Name: [phone] Interpreter Phone Number - If applicable: [phone]        Follow-Up Questions:    Inocente Salles Postnatal Depression Scale:  In the Past 7 Days:    PHQ2-9 Depression Scale:     Discharge Follow-up:    Post-discharge interventions: NA  Signature  Signe Colt

## 2023-12-15 ENCOUNTER — Ambulatory Visit

## 2023-12-15 DIAGNOSIS — Z23 Encounter for immunization: Secondary | ICD-10-CM

## 2023-12-15 NOTE — Congregational Nurse Program (Signed)
 Seen at [facility] for screening .  BP 90/53, p.75, No liquids this morning.  Will hydrate and retake.  BP reading after drinking beverage was 122/58, p. 63.  Encouraged to keep hydrated.  Will recheck BP in 1 month.  Merlynn Pines, RN CN, [phone].
